# Patient Record
Sex: Male | Born: 1935 | Race: White | Hispanic: No | Smoking: Never smoker
Health system: Southern US, Community
[De-identification: ages and names within clinical notes are randomized; demographics above are authoritative.]

## PROBLEM LIST (undated history)

## (undated) DIAGNOSIS — I429 Cardiomyopathy, unspecified: Secondary | ICD-10-CM

## (undated) DIAGNOSIS — N400 Enlarged prostate without lower urinary tract symptoms: Secondary | ICD-10-CM

## (undated) DIAGNOSIS — I4891 Unspecified atrial fibrillation: Secondary | ICD-10-CM

## (undated) DIAGNOSIS — J309 Allergic rhinitis, unspecified: Secondary | ICD-10-CM

## (undated) DIAGNOSIS — I519 Heart disease, unspecified: Secondary | ICD-10-CM

## (undated) DIAGNOSIS — J449 Chronic obstructive pulmonary disease, unspecified: Secondary | ICD-10-CM

## (undated) DIAGNOSIS — G4700 Insomnia, unspecified: Secondary | ICD-10-CM

## (undated) DIAGNOSIS — I10 Essential (primary) hypertension: Secondary | ICD-10-CM

## (undated) DIAGNOSIS — E785 Hyperlipidemia, unspecified: Secondary | ICD-10-CM

## (undated) DIAGNOSIS — G4733 Obstructive sleep apnea (adult) (pediatric): Secondary | ICD-10-CM

## (undated) DIAGNOSIS — H409 Unspecified glaucoma: Secondary | ICD-10-CM

## (undated) DIAGNOSIS — L729 Follicular cyst of the skin and subcutaneous tissue, unspecified: Secondary | ICD-10-CM

## (undated) DIAGNOSIS — R310 Gross hematuria: Secondary | ICD-10-CM

## (undated) HISTORY — DX: Chronic obstructive pulmonary disease, unspecified: J44.9

## (undated) HISTORY — DX: Benign prostatic hyperplasia without lower urinary tract symptoms: N40.0

## (undated) HISTORY — DX: Hyperlipidemia, unspecified: E78.5

## (undated) HISTORY — DX: Allergic rhinitis, unspecified: J30.9

## (undated) HISTORY — PX: OTHER SURGICAL HISTORY: SHX169

## (undated) HISTORY — PX: ANKLE SURGERY: SHX546

## (undated) HISTORY — DX: Insomnia, unspecified: G47.00

## (undated) HISTORY — DX: Cardiomyopathy, unspecified: I42.9

## (undated) HISTORY — DX: Essential (primary) hypertension: I10

## (undated) HISTORY — DX: Follicular cyst of the skin and subcutaneous tissue, unspecified: L72.9

## (undated) HISTORY — DX: Unspecified atrial fibrillation: I48.91

## (undated) HISTORY — DX: Gross hematuria: R31.0

## (undated) HISTORY — DX: Unspecified glaucoma: H40.9

## (undated) HISTORY — DX: Obstructive sleep apnea (adult) (pediatric): G47.33

## (undated) HISTORY — DX: Heart disease, unspecified: I51.9

---

## 2005-01-17 ENCOUNTER — Ambulatory Visit: Payer: Self-pay

## 2005-07-17 ENCOUNTER — Ambulatory Visit: Payer: Self-pay | Admitting: Urology

## 2008-01-25 ENCOUNTER — Ambulatory Visit: Payer: Self-pay | Admitting: Gastroenterology

## 2011-05-28 ENCOUNTER — Ambulatory Visit: Payer: Self-pay | Admitting: Specialist

## 2014-08-18 DIAGNOSIS — H3320 Serous retinal detachment, unspecified eye: Secondary | ICD-10-CM | POA: Insufficient documentation

## 2014-08-19 DIAGNOSIS — H409 Unspecified glaucoma: Secondary | ICD-10-CM | POA: Insufficient documentation

## 2014-08-19 DIAGNOSIS — G250 Essential tremor: Secondary | ICD-10-CM | POA: Insufficient documentation

## 2014-08-19 DIAGNOSIS — H3530 Unspecified macular degeneration: Secondary | ICD-10-CM | POA: Insufficient documentation

## 2015-02-13 ENCOUNTER — Ambulatory Visit: Payer: Self-pay | Admitting: Urology

## 2015-02-21 DIAGNOSIS — R319 Hematuria, unspecified: Secondary | ICD-10-CM | POA: Insufficient documentation

## 2015-02-28 DIAGNOSIS — N200 Calculus of kidney: Secondary | ICD-10-CM | POA: Insufficient documentation

## 2015-06-19 ENCOUNTER — Other Ambulatory Visit: Payer: Self-pay | Admitting: Family Medicine

## 2015-06-19 DIAGNOSIS — R310 Gross hematuria: Secondary | ICD-10-CM

## 2015-08-21 ENCOUNTER — Other Ambulatory Visit
Admission: RE | Admit: 2015-08-21 | Discharge: 2015-08-21 | Disposition: A | Discharge status: Home / Self Care | Payer: Medicare Other | Source: Ambulatory Visit | Attending: Urology | Admitting: Urology

## 2015-08-21 ENCOUNTER — Ambulatory Visit
Admission: RE | Admit: 2015-08-21 | Discharge: 2015-08-21 | Disposition: A | Discharge status: Home / Self Care | Payer: Medicare Other | Source: Ambulatory Visit | Attending: Urology | Admitting: Urology

## 2015-08-21 DIAGNOSIS — N138 Other obstructive and reflux uropathy: Secondary | ICD-10-CM | POA: Insufficient documentation

## 2015-08-21 DIAGNOSIS — N200 Calculus of kidney: Secondary | ICD-10-CM | POA: Diagnosis not present

## 2015-08-21 DIAGNOSIS — N401 Enlarged prostate with lower urinary tract symptoms: Secondary | ICD-10-CM | POA: Insufficient documentation

## 2015-08-21 DIAGNOSIS — R310 Gross hematuria: Secondary | ICD-10-CM | POA: Diagnosis present

## 2015-08-21 LAB — CREATININE, SERUM
Creatinine, Ser: 0.74 mg/dL (ref 0.61–1.24)
GFR calc Af Amer: >60 mL/min (ref >60)
GFR calc non Af Amer: >60 mL/min (ref >60)

## 2015-08-21 LAB — BUN: BUN: 15 mg/dL (ref 6–20)

## 2015-08-21 MED ORDER — IOHEXOL 300 MG/ML  SOLN
125.0000 mL | Freq: Once | INTRAMUSCULAR | Status: AC | PRN
  Administered 2015-08-21: 125 mL via INTRAVENOUS

## 2015-08-24 ENCOUNTER — Ambulatory Visit: Payer: Medicare Other | Admitting: Urology

## 2015-08-24 ENCOUNTER — Encounter: Payer: Self-pay | Admitting: Urology

## 2015-08-24 VITALS — BP 132/64 | HR 53 | Ht 74.0 in | Wt 262.9 lb

## 2015-08-24 DIAGNOSIS — F329 Major depressive disorder, single episode, unspecified: Secondary | ICD-10-CM | POA: Insufficient documentation

## 2015-08-24 DIAGNOSIS — N400 Enlarged prostate without lower urinary tract symptoms: Secondary | ICD-10-CM | POA: Insufficient documentation

## 2015-08-24 DIAGNOSIS — N442 Benign cyst of testis: Secondary | ICD-10-CM | POA: Insufficient documentation

## 2015-08-24 DIAGNOSIS — G4700 Insomnia, unspecified: Secondary | ICD-10-CM | POA: Insufficient documentation

## 2015-08-24 DIAGNOSIS — G4733 Obstructive sleep apnea (adult) (pediatric): Secondary | ICD-10-CM | POA: Insufficient documentation

## 2015-08-24 DIAGNOSIS — F32A Depression, unspecified: Secondary | ICD-10-CM | POA: Insufficient documentation

## 2015-08-24 DIAGNOSIS — N401 Enlarged prostate with lower urinary tract symptoms: Secondary | ICD-10-CM

## 2015-08-24 DIAGNOSIS — N209 Urinary calculus, unspecified: Secondary | ICD-10-CM

## 2015-08-24 DIAGNOSIS — J449 Chronic obstructive pulmonary disease, unspecified: Secondary | ICD-10-CM | POA: Insufficient documentation

## 2015-08-24 DIAGNOSIS — N138 Other obstructive and reflux uropathy: Secondary | ICD-10-CM

## 2015-08-24 DIAGNOSIS — E7800 Pure hypercholesterolemia, unspecified: Secondary | ICD-10-CM | POA: Insufficient documentation

## 2015-08-24 DIAGNOSIS — IMO0002 Reserved for concepts with insufficient information to code with codable children: Secondary | ICD-10-CM | POA: Insufficient documentation

## 2015-08-24 DIAGNOSIS — J309 Allergic rhinitis, unspecified: Secondary | ICD-10-CM | POA: Insufficient documentation

## 2015-08-24 DIAGNOSIS — I429 Cardiomyopathy, unspecified: Secondary | ICD-10-CM | POA: Insufficient documentation

## 2015-08-24 DIAGNOSIS — R319 Hematuria, unspecified: Secondary | ICD-10-CM

## 2015-08-24 DIAGNOSIS — I4891 Unspecified atrial fibrillation: Secondary | ICD-10-CM | POA: Insufficient documentation

## 2015-08-24 LAB — URINALYSIS, COMPLETE
BILIRUBIN UA: NEGATIVE
Glucose, UA: NEGATIVE
LEUKOCYTES UA: NEGATIVE
NITRITE UA: NEGATIVE
PH UA: 6.5 (ref 5.0–7.5)
Protein, UA: NEGATIVE
RBC UA: NEGATIVE
SPEC GRAV UA: 1.02 (ref 1.005–1.030)

## 2015-08-24 LAB — MICROSCOPIC EXAMINATION
Bacteria, UA: NONE SEEN
Epithelial Cells (non renal): NONE SEEN /hpf (ref 0–10)

## 2015-08-24 NOTE — Progress Notes (Signed)
Urology History and Physical Exam  CC: follow-up of hematuria  HPI: 81 year old male who lives by himself comes in today for follow-up of gross hematuria and BPH. He was evaluated for this earlier this year. Evaluation included CT scan of the abdomen and pelvis as well as cystoscopy. He does have bilateral kidney stones, as well as probable ruptured cyst in the left kidney/retroperitoneum. It was felt that, after this full evaluation, that he had BPH/prostaticissues causing his bleeding. He was appropriately placed on finasteride. He is here today for follow-up.  He denies any significant history of hematuria recently. His stream is a bit better. At times, maybe when he is constipated, it slows down. He has nocturia 2-3. He treats this himself with limiting afternoon and evening fluids.  PMH: Past Medical History  Diagnosis Date  . Cardiomyopathy   . Insomnia   . Obstructive sleep apnea   . Scrotal cyst   . Atrial fibrillation   . BPH (benign prostatic hyperplasia)   . Gross hematuria   . Hypertension   . Hyperlipidemia   . Glaucoma   . COPD (chronic obstructive pulmonary disease)   . Heart disease   . Allergic rhinitis     PSH: Past Surgical History  Procedure Laterality Date  . Testicular cyst    . Detached retina repair    . Ankle surgery      Allergies: No Known Allergies  Medications:  (Not in a hospital admission)   Social History: Social History   Social History  . Marital Status: Widowed    Spouse Name: N/A  . Number of Children: N/A  . Years of Education: N/A   Occupational History  . Not on file.   Social History Main Topics  . Smoking status: Never Smoker   . Smokeless tobacco: Not on file  . Alcohol Use: No  . Drug Use: No  . Sexual Activity: Not on file   Other Topics Concern  . Not on file   Social History Narrative    Family History: No family history on file.  Review of Systems: Positive: nocturia, occasional slow  stream Negative: .  A further 10 point review of systems was negative except what is listed in the HPI.                  Physical Exam: Filed Vitals:   08/24/15 1148  Height:  (1.88 m)  Weight: 262 lb 14.4 oz (119.251 kg)   General: No acute distress.  Awake. Head:  Normocephalic.  Atraumatic. ENT:  EOMI.  Mucous membranes moist Pulmonary: Equal effort bilaterally.   No visible rash. Extremity: No gross deformity of bilateral upper extremities.  No gross deformity of     lower extremities. Neurologic: Alert. Appropriate mood.    Studies:  No results for input(s): HGB, WBC, PLT in the last 72 hours.  Recent Labs     08/21/15  1243  BUN  15  CREATININE  0.74  GFRNONAA  >60  GFRAA  >60     No results for input(s): INR, APTT in the last 72 hours.  Invalid input(s): PT   Invalid input(s): ABG  Urinalysis is clear. I reviewed the patient's prior CT records. Evaluation was also reviewed Assessment:   1. BPH with probable hematuria, improved with finasteride  2. Possible ruptured left renal cyst, asymptomatic   3. Asymptomatic renal calculi  Plan: 1. I recommended that he continue his finasteride long-term  2. I recommended that he  increase fiber intake to decrease constipation  3. Office visit in 1 year for recheck

## 2015-12-18 ENCOUNTER — Inpatient Hospital Stay
Admission: EM | Admit: 2015-12-18 | Discharge: 2015-12-19 | DRG: 310 | Disposition: A | Discharge status: Home / Self Care | Payer: Medicare Other | Attending: Internal Medicine | Admitting: Internal Medicine

## 2015-12-18 ENCOUNTER — Encounter: Payer: Self-pay | Admitting: Emergency Medicine

## 2015-12-18 DIAGNOSIS — I482 Chronic atrial fibrillation: Secondary | ICD-10-CM | POA: Diagnosis present

## 2015-12-18 DIAGNOSIS — G4733 Obstructive sleep apnea (adult) (pediatric): Secondary | ICD-10-CM | POA: Diagnosis present

## 2015-12-18 DIAGNOSIS — E785 Hyperlipidemia, unspecified: Secondary | ICD-10-CM | POA: Diagnosis present

## 2015-12-18 DIAGNOSIS — I495 Sick sinus syndrome: Principal | ICD-10-CM | POA: Diagnosis present

## 2015-12-18 DIAGNOSIS — R001 Bradycardia, unspecified: Secondary | ICD-10-CM | POA: Diagnosis not present

## 2015-12-18 DIAGNOSIS — N400 Enlarged prostate without lower urinary tract symptoms: Secondary | ICD-10-CM | POA: Diagnosis present

## 2015-12-18 DIAGNOSIS — I10 Essential (primary) hypertension: Secondary | ICD-10-CM | POA: Diagnosis present

## 2015-12-18 DIAGNOSIS — J449 Chronic obstructive pulmonary disease, unspecified: Secondary | ICD-10-CM | POA: Diagnosis present

## 2015-12-18 DIAGNOSIS — F419 Anxiety disorder, unspecified: Secondary | ICD-10-CM | POA: Diagnosis present

## 2015-12-18 DIAGNOSIS — I429 Cardiomyopathy, unspecified: Secondary | ICD-10-CM | POA: Diagnosis present

## 2015-12-18 DIAGNOSIS — F329 Major depressive disorder, single episode, unspecified: Secondary | ICD-10-CM | POA: Diagnosis present

## 2015-12-18 DIAGNOSIS — Z7982 Long term (current) use of aspirin: Secondary | ICD-10-CM | POA: Diagnosis not present

## 2015-12-18 DIAGNOSIS — Z79899 Other long term (current) drug therapy: Secondary | ICD-10-CM | POA: Diagnosis not present

## 2015-12-18 LAB — URINALYSIS COMPLETE WITH MICROSCOPIC (ARMC ONLY)
Bacteria, UA: NONE SEEN
Bilirubin Urine: NEGATIVE
GLUCOSE, UA: NEGATIVE mg/dL
LEUKOCYTES UA: NEGATIVE
NITRITE: NEGATIVE
Protein, ur: 30 mg/dL — AB
SPECIFIC GRAVITY, URINE: 1.017 (ref 1.005–1.030)
Squamous Epithelial / LPF: NONE SEEN
pH: 6 (ref 5.0–8.0)

## 2015-12-18 LAB — BASIC METABOLIC PANEL
Anion gap: 11 (ref 5–15)
BUN: 17 mg/dL (ref 6–20)
CALCIUM: 9.5 mg/dL (ref 8.9–10.3)
CO2: 26 mmol/L (ref 22–32)
CREATININE: 0.62 mg/dL (ref 0.61–1.24)
Chloride: 105 mmol/L (ref 101–111)
GFR calc Af Amer: >60 mL/min (ref >60)
GLUCOSE: 109 mg/dL — AB (ref 65–99)
POTASSIUM: 4.2 mmol/L (ref 3.5–5.1)
Sodium: 142 mmol/L (ref 135–145)

## 2015-12-18 LAB — CBC
HCT: 42.8 % (ref 40.0–52.0)
Hemoglobin: 13.8 g/dL (ref 13.0–18.0)
MCH: 31.6 pg (ref 26.0–34.0)
MCHC: 32.1 g/dL (ref 32.0–36.0)
MCV: 98.4 fL (ref 80.0–100.0)
PLATELETS: 83 10*3/uL — AB (ref 150–440)
RBC: 4.35 MIL/uL — ABNORMAL LOW (ref 4.40–5.90)
RDW: 14.3 % (ref 11.5–14.5)
WBC: 6.1 10*3/uL (ref 3.8–10.6)

## 2015-12-18 LAB — HEPATIC FUNCTION PANEL
ALK PHOS: 66 U/L (ref 38–126)
ALT: 24 U/L (ref 17–63)
AST: 27 U/L (ref 15–41)
Albumin: 4.3 g/dL (ref 3.5–5.0)
BILIRUBIN DIRECT: 0.4 mg/dL (ref 0.1–0.5)
BILIRUBIN INDIRECT: 1.3 mg/dL — AB (ref 0.3–0.9)
BILIRUBIN TOTAL: 1.7 mg/dL — AB (ref 0.3–1.2)
Total Protein: 6.8 g/dL (ref 6.5–8.1)

## 2015-12-18 LAB — TSH
TSH: 1.64 u[IU]/mL (ref 0.350–4.500)
TSH: 1.478 u[IU]/mL (ref 0.350–4.500)

## 2015-12-18 LAB — BRAIN NATRIURETIC PEPTIDE: B Natriuretic Peptide: 404 pg/mL — ABNORMAL HIGH (ref 0.0–100.0)

## 2015-12-18 LAB — TROPONIN I
TROPONIN I: 0.04 ng/mL — AB (ref <0.031)
TROPONIN I: 0.04 ng/mL — AB (ref <0.031)
TROPONIN I: 0.05 ng/mL — AB (ref <0.031)

## 2015-12-18 LAB — LIPASE, BLOOD: Lipase: 19 U/L (ref 11–51)

## 2015-12-18 MED ORDER — NAPROXEN 500 MG PO TABS: 250.0000 mg | ORAL_TABLET | Freq: Every day | ORAL | Status: DC | PRN

## 2015-12-18 MED ORDER — NAPROXEN SODIUM 220 MG PO TABS: 220.0000 mg | ORAL_TABLET | Freq: Every day | ORAL | Status: DC | PRN

## 2015-12-18 MED ORDER — PAROXETINE HCL 20 MG PO TABS
20.0000 mg | ORAL_TABLET | Freq: Every evening | ORAL | Status: DC
Start: 2015-12-18 — End: 2015-12-19
  Administered 2015-12-18: 20 mg via ORAL
  Filled 2015-12-18: qty 1

## 2015-12-18 MED ORDER — DOXAZOSIN MESYLATE 4 MG PO TABS
4.0000 mg | ORAL_TABLET | Freq: Every day | ORAL | Status: DC
  Administered 2015-12-18 – 2015-12-19 (×2): 4 mg via ORAL
  Filled 2015-12-18 (×2): qty 1

## 2015-12-18 MED ORDER — SODIUM CHLORIDE 0.9 % IV SOLN: 250.0000 mL | INTRAVENOUS | Status: DC | PRN

## 2015-12-18 MED ORDER — ASPIRIN 81 MG PO CHEW
324.0000 mg | CHEWABLE_TABLET | Freq: Once | ORAL | Status: AC
  Administered 2015-12-18: 324 mg via ORAL
  Filled 2015-12-18: qty 4

## 2015-12-18 MED ORDER — IPRATROPIUM-ALBUTEROL 0.5-2.5 (3) MG/3ML IN SOLN: 3.0000 mL | RESPIRATORY_TRACT | Status: DC | PRN

## 2015-12-18 MED ORDER — SODIUM CHLORIDE 0.9 % IJ SOLN
3.0000 mL | Freq: Two times a day (BID) | INTRAMUSCULAR | Status: DC
Start: 2015-12-18 — End: 2015-12-19
  Administered 2015-12-18 – 2015-12-19 (×2): 3 mL via INTRAVENOUS

## 2015-12-18 MED ORDER — SODIUM CHLORIDE 0.9 % IJ SOLN
3.0000 mL | INTRAMUSCULAR | Status: DC | PRN
Start: 2015-12-18 — End: 2015-12-19

## 2015-12-18 MED ORDER — ACETAMINOPHEN 650 MG RE SUPP: 650.0000 mg | Freq: Four times a day (QID) | RECTAL | Status: DC | PRN

## 2015-12-18 MED ORDER — ASPIRIN EC 81 MG PO TBEC
81.0000 mg | DELAYED_RELEASE_TABLET | Freq: Once | ORAL | Status: AC
  Administered 2015-12-18: 81 mg via ORAL
  Filled 2015-12-18: qty 1

## 2015-12-18 MED ORDER — ACETAMINOPHEN 325 MG PO TABS: 650.0000 mg | ORAL_TABLET | Freq: Four times a day (QID) | ORAL | Status: DC | PRN

## 2015-12-18 MED ORDER — AMLODIPINE BESYLATE 5 MG PO TABS
2.5000 mg | ORAL_TABLET | Freq: Every day | ORAL | Status: DC
  Administered 2015-12-19: 2.5 mg via ORAL
  Filled 2015-12-18: qty 1

## 2015-12-18 MED ORDER — ONDANSETRON HCL 4 MG/2ML IJ SOLN: 4.0000 mg | Freq: Four times a day (QID) | INTRAMUSCULAR | Status: DC | PRN

## 2015-12-18 MED ORDER — SIMVASTATIN 20 MG PO TABS
20.0000 mg | ORAL_TABLET | Freq: Every day | ORAL | Status: DC
  Administered 2015-12-18: 20 mg via ORAL
  Filled 2015-12-18: qty 1

## 2015-12-18 MED ORDER — HEPARIN SODIUM (PORCINE) 5000 UNIT/ML IJ SOLN
5000.0000 [IU] | Freq: Three times a day (TID) | INTRAMUSCULAR | Status: DC
  Administered 2015-12-18 – 2015-12-19 (×3): 5000 [IU] via SUBCUTANEOUS
  Filled 2015-12-18 (×2): qty 1

## 2015-12-18 MED ORDER — ONDANSETRON HCL 4 MG PO TABS: 4.0000 mg | ORAL_TABLET | Freq: Four times a day (QID) | ORAL | Status: DC | PRN

## 2015-12-18 MED ORDER — ASPIRIN EC 81 MG PO TBEC
81.0000 mg | DELAYED_RELEASE_TABLET | Freq: Every day | ORAL | Status: DC
  Administered 2015-12-19: 81 mg via ORAL
  Filled 2015-12-18: qty 1

## 2015-12-18 MED ORDER — FINASTERIDE 5 MG PO TABS
5.0000 mg | ORAL_TABLET | Freq: Every day | ORAL | Status: DC
  Administered 2015-12-19: 5 mg via ORAL
  Filled 2015-12-18: qty 1

## 2015-12-18 MED ORDER — ADULT MULTIVITAMIN W/MINERALS CH
1.0000 | ORAL_TABLET | Freq: Every day | ORAL | Status: DC
  Administered 2015-12-18 – 2015-12-19 (×2): 1 via ORAL
  Filled 2015-12-18 (×2): qty 1

## 2015-12-18 NOTE — ED Notes (Signed)
Pt presents with nausea and weakness for three weeks. Was told by pcp to come to ED for further eval.

## 2015-12-18 NOTE — ED Notes (Signed)
Patient was sent by PCP. Patient states "I had an EKG done last week and I called my doctors office today for results and was told to come to ER" Patient is bradycardia with HR fluctuating from 33-65. Patient is A&O X4

## 2015-12-18 NOTE — Consult Note (Signed)
[facility] Cardiology  CARDIOLOGY CONSULT NOTE  Patient ID: Shaun Curtis MRN: [id number] DOB/AGE: 04-19-1929 81 y.o.  Admit date: 12/18/2015 Referring Physician Allena Katz Primary Physician [facility] Primary Cardiologist Gwen Pounds Reason for Consultation bradycardia  HPI: The patient is an 81 year old gentleman with history of chronic atrial fibrillation, essential hypertension, sleep apnea, referred for evaluation of bradycardia. The patient reports approximately 2 months ago he was started on propanolol or tremors. He presents with 3-4 week history of fatigue, weakness and intermittent lightheadedness. The patient presented to [facility] emergency room where his noted be in junctional bradycardia with heart rates in the 40s. The patient is hemodynamically stable. He denies chest pain. He does have exertional dyspnea and postural dizziness.  Review of systems complete and found to be negative unless listed above     Past Medical History  Diagnosis Date  . Cardiomyopathy (HCC)   . Insomnia   . Obstructive sleep apnea   . Scrotal cyst   . Atrial fibrillation (HCC)   . BPH (benign prostatic hyperplasia)   . Gross hematuria   . Hypertension   . Hyperlipidemia   . Glaucoma   . COPD (chronic obstructive pulmonary disease) (HCC)   . Heart disease   . Allergic rhinitis     Past Surgical History  Procedure Laterality Date  . Testicular cyst    . Detached retina repair    . Ankle surgery      Prescriptions prior to admission  Medication Sig Dispense Refill Last Dose  . amLODipine (NORVASC) 2.5 MG tablet TAKE 1 TABLET (2.5 MG TOTAL) BY MOUTH ONCE DAILY. FOR BLOOD PRESSURE  99 12/18/2015 at 0830  . aspirin EC 81 MG tablet Take 81 mg by mouth daily.    12/18/2015 at 0830  . Brinzolamide-Brimonidine (SIMBRINZA) 1-0.2 % SUSP Place 1 drop into both eyes 2 (two) times daily.    12/18/2015 at 0830  . doxazosin (CARDURA) 4 MG tablet TAKE 1 TABLET (4 MG TOTAL) BY MOUTH NIGHTLY. FOR BLOOD PRESSURE AND  PROSTATE  99 12/17/2015 at Unknown time  . finasteride (PROSCAR) 5 MG tablet Take 5 mg by mouth daily.    12/18/2015 at 0830  . Multiple Vitamins-Minerals (ICAPS) CAPS Take 1 capsule by mouth daily.    12/17/2015 at Unknown time  . naproxen sodium (RA NAPROXEN SODIUM) 220 MG tablet Take 220 mg by mouth daily as needed.    prn at prn  . PARoxetine (PAXIL) 20 MG tablet Take 20 mg by mouth every evening.    12/17/2015 at Unknown time  . propranolol (INDERAL) 20 MG tablet Take 20 mg by mouth 2 (two) times daily.   12/18/2015 at 0830  . simvastatin (ZOCOR) 20 MG tablet Take 20 mg by mouth daily at 6 PM.    12/17/2015 at Unknown time   Social History   Social History  . Marital Status: Widowed    Spouse Name: N/A  . Number of Children: N/A  . Years of Education: N/A   Occupational History  . Not on file.   Social History Main Topics  . Smoking status: Never Smoker   . Smokeless tobacco: Not on file  . Alcohol Use: No  . Drug Use: No  . Sexual Activity: Not on file   Other Topics Concern  . Not on file   Social History Narrative    Family History  Problem Relation Age of Onset  . Hypertension        Review of systems complete and found to be negative  unless listed above      PHYSICAL EXAM  General: Well developed, well nourished, in no acute distress HEENT:  Normocephalic and atramatic Neck:  No JVD.  Lungs: Clear bilaterally to auscultation and percussion. Heart: HRRR . Normal S1 and S2 without gallops or murmurs.  Abdomen: Bowel sounds are positive, abdomen soft and non-tender  Msk:  Back normal, normal gait. Normal strength and tone for age. Extremities: No clubbing, cyanosis or edema.   Neuro: Alert and oriented X 3. Psych:  Good affect, responds appropriately  Labs:   Lab Results  Component Value Date   WBC 6.1 12/18/2015   HGB 13.8 12/18/2015   HCT 42.8 12/18/2015   MCV 98.4 12/18/2015   PLT 83* 12/18/2015    Recent Labs Lab 12/18/15 1155  NA 142  K  4.2  CL 105  CO2 26  BUN 17  CREATININE 0.62  CALCIUM 9.5  PROT 6.8  BILITOT 1.7*  ALKPHOS 66  ALT 24  AST 27  GLUCOSE 109*   Lab Results  Component Value Date   TROPONINI 0.04* 12/18/2015   No results found for: CHOL No results found for: HDL No results found for: LDLCALC No results found for: TRIG No results found for: CHOLHDL No results found for: LDLDIRECT    Radiology: No results found.  EKG: Junctional bradycardia in the 40s  ASSESSMENT AND PLAN:   1. Junctional bradycardia, likely exacerbated by recent addition of propranolol for treatment of tremors 2. Chronic atrial fibrillation, chads Vasc of 3, currently on aspirin 3. Essential hypertension  Recommendations  1. Hold propanolol 2. Review 2-D echocardiogram 3. No indication at this time for external or transvenous pacemaker, patient hemodynamically stable 4. Further recommendations, including permanent pacemaker, pending patient's initial clinical course off of propranolol    Signed: Kaisyn Reinhold MD,PhD, [facility] 12/18/2015, 5:08 PM

## 2015-12-18 NOTE — Progress Notes (Signed)
Patient admitted to floor. A@O . No c/o pain. Sob with exertion, tolerating RA. Telemetry verified by nurse and tech current hr 60's. Skin verified by second nurse doll ferguson rn

## 2015-12-18 NOTE — ED Provider Notes (Signed)
[facility] Department Provider Note  ____________________________________________   I have reviewed the triage vital signs and the nursing notes.   HISTORY  Chief Complaint Nausea and Fatigue    HPI Shaun Curtis is a 81 y.o. male with a history of not otherwise specified but not apparently ischemic cardiomyopathy with EF of 45% during his last echo presents to 3 weeks of nausea especially when he exerts himself as well as shortness of breath on exertion. Denies fever chills or vomiting. No chest pain. Denies significant leg swelling. Is compliant with his medication. Had an EKG done today by primary care and was sent in for further evaluation. Patient at this time is feeling "okay".  Past Medical History  Diagnosis Date  . Cardiomyopathy (HCC)   . Insomnia   . Obstructive sleep apnea   . Scrotal cyst   . Atrial fibrillation (HCC)   . BPH (benign prostatic hyperplasia)   . Gross hematuria   . Hypertension   . Hyperlipidemia   . Glaucoma   . COPD (chronic obstructive pulmonary disease) (HCC)   . Heart disease   . Allergic rhinitis     Patient Active Problem List   Diagnosis Date Noted  . Allergic rhinitis 08/24/2015  . A-fib (HCC) 08/24/2015  . Benign fibroma of prostate 08/24/2015  . Cardiomyopathy (HCC) 08/24/2015  . Insomnia, persistent 08/24/2015  . CAFL (chronic airflow limitation) (HCC) 08/24/2015  . Clinical depression 08/24/2015  . Hypercholesteremia 08/24/2015  . Obstructive apnea 08/24/2015  . Cyst of testis 08/24/2015  . Change in blood platelet count 08/24/2015  . Calculus of kidney 02/28/2015  . Blood in the urine 02/21/2015  . Age-related macular degeneration 08/19/2014  . Benign essential tremor 08/19/2014  . Glaucoma 08/19/2014  . Detached retina 08/18/2014    Past Surgical History  Procedure Laterality Date  . Testicular cyst    . Detached retina repair    . Ankle surgery      Current Outpatient Rx   Name  Route  Sig  Dispense  Refill  . amLODipine (NORVASC) 2.5 MG tablet      TAKE 1 TABLET (2.5 MG TOTAL) BY MOUTH ONCE DAILY. FOR BLOOD PRESSURE      99   . aspirin EC 81 MG tablet   Oral   Take by mouth.         . Brinzolamide-Brimonidine (SIMBRINZA) 1-0.2 % SUSP   Ophthalmic   Apply to eye.         Marland Kitchen. doxazosin (CARDURA) 4 MG tablet      TAKE 1 TABLET (4 MG TOTAL) BY MOUTH NIGHTLY. FOR BLOOD PRESSURE AND PROSTATE      99   . finasteride (PROSCAR) 5 MG tablet               . Multiple Vitamins-Minerals (ICAPS) CAPS   Oral   Take by mouth.         . naproxen sodium (RA NAPROXEN SODIUM) 220 MG tablet   Oral   Take by mouth.         Marland Kitchen. PARoxetine (PAXIL) 20 MG tablet   Oral   Take by mouth.         . simvastatin (ZOCOR) 20 MG tablet                 Allergies Review of patient's allergies indicates no known allergies.  No family history on file.  Social History Social History  Substance Use Topics  . Smoking  status: Never Smoker   . Smokeless tobacco: None  . Alcohol Use: No    Review of Systems Constitutional: No fever/chills Eyes: No visual changes. ENT: No sore throat. No stiff neck no neck pain Cardiovascular: Denies chest pain. Respiratory: Positive for shortness of breath. Gastrointestinal:   no vomiting.  No diarrhea.  No constipation.no  Melena no bright red blood per rectum Genitourinary: Negative for dysuria. Musculoskeletal: Negative lower extremity swelling Skin: Negative for rash. Neurological: Negative for headaches, focal weakness or numbness. 10-point ROS otherwise negative.  ____________________________________________   PHYSICAL EXAM:  VITAL SIGNS: ED Triage Vitals  Enc Vitals Group     BP 12/18/15 1149 163/57 mmHg     Pulse Rate 12/18/15 1149 78     Resp 12/18/15 1149 18     Temp 12/18/15 1149 98.4 F (36.9 C)     Temp Source 12/18/15 1149 Oral     SpO2 12/18/15 1230 97 %     Weight 12/18/15 1149 260 lb  (117.935 kg)     Height 12/18/15 1149  (1.88 m)     Head Cir --      Peak Flow --      Pain Score --      Pain Loc --      Pain Edu? --      Excl. in GC? --     Constitutional: Alert and oriented. Well appearing and in no acute distress. Eyes: Conjunctivae are normal. PERRL. EOMI. Head: Atraumatic. Nose: No congestion/rhinnorhea. Mouth/Throat: Mucous membranes are moist.  Oropharynx non-erythematous. Neck: No stridor.   Nontender with no meningismus Cardiovascular: Bradycardia noted but no obvious murmur, good peripheral circulation Respiratory: Normal respiratory effort.  No retractions. Lungs CTAB. Abdominal: Soft and nontender. No distention. No guarding no rebound Back:  There is no focal tenderness or step off there is no midline tenderness there are no lesions noted. there is no CVA tenderness Musculoskeletal: No lower extremity tenderness. No joint effusions, no DVT signs strong distal pulses no edema Neurologic:  Normal speech and language. No gross focal neurologic deficits are appreciated.  Skin:  Skin is warm, dry and intact. No rash noted. Psychiatric: Mood and affect are normal. Speech and behavior are normal.  ____________________________________________   LABS (all labs ordered are listed, but only abnormal results are displayed)  Labs Reviewed  BASIC METABOLIC PANEL - Abnormal; Notable for the following:    Glucose, Bld 109 (*)    All other components within normal limits  CBC - Abnormal; Notable for the following:    RBC 4.35 (*)    Platelets 83 (*)    All other components within normal limits  URINALYSIS COMPLETEWITH MICROSCOPIC ([facility] ONLY) - Abnormal; Notable for the following:    Color, Urine YELLOW (*)    APPearance HAZY (*)    Ketones, ur TRACE (*)    Hgb urine dipstick 3+ (*)    Protein, ur 30 (*)    All other components within normal limits  TROPONIN I  TSH  BRAIN NATRIURETIC PEPTIDE  HEPATIC FUNCTION PANEL  LIPASE, BLOOD    ____________________________________________  EKG  I personally interpreted any EKGs ordered by me or triage Bradycardic rhythm, likely junctional, diffuse ST changes noted no acute ST elevation. ____________________________________________  RADIOLOGY  I reviewed any imaging ordered by me or triage that were performed during my shift ____________________________________________   PROCEDURES  Procedure(s) performed: None  Critical Care performed: None  ____________________________________________   INITIAL IMPRESSION / ASSESSMENT AND PLAN /  ED COURSE  Pertinent labs & imaging results that were available during my care of the patient were reviewed by me and considered in my medical decision making (see chart for details).  Patient with history of cardiomyopathy presents without chest pain but with nausea and exertional dyspnea and a bradycardia but preserved blood pressure. I do suspect his bradycardia is secondary to an underlying cardiac pathology. Patient has a borderline troponin. We will give him aspirin. I did discuss with his cardiologist Dr. Gwen Pounds, who agrees with plan and management and the hospitalist will admit the patient. ____________________________________________   FINAL CLINICAL IMPRESSION(S) / ED DIAGNOSES  Final diagnoses:  None     Jeanmarie Plant, MD 12/18/15 1404

## 2015-12-18 NOTE — H&P (Signed)
[facility] - [place] at [facility]   PATIENT NAME: Shaun Curtis    MR#:  [id number]  DATE OF BIRTH:  05/27/1929  DATE OF ADMISSION:  12/18/2015  PRIMARY CARE PHYSICIAN: Mickey Farber, MD   REQUESTING/REFERRING PHYSICIAN: Ileana Roup M.D.  CHIEF COMPLAINT:   Chief Complaint  Patient presents with  . Nausea  . Fatigue    HISTORY OF PRESENT ILLNESS: Shaun Curtis  is a 81 y.o. male with a known history of  Cardiomyopathy, obstructive sleep apnea not using CPAP on regular basis, atrial fibrillation, BPH, hypertension, hyperlipidemia, and COPD who presents to the emergency room with complaint of having nausea for the past 4 weeks. He has also had progressive fatigue and this and weakness. He has had episodes of near syncope. He also complains of dyspnea on exertion. Patient presented to the emergency room with these complaints and was noted to have bradycardia with junctional rhythm concerning for sick sinus syndrome. The emergency room physician has spoken to his cardiologist Dr. Gwen Pounds who will evaluate the patient.  PAST MEDICAL HISTORY:   Past Medical History  Diagnosis Date  . Cardiomyopathy (HCC)   . Insomnia   . Obstructive sleep apnea   . Scrotal cyst   . Atrial fibrillation (HCC)   . BPH (benign prostatic hyperplasia)   . Gross hematuria   . Hypertension   . Hyperlipidemia   . Glaucoma   . COPD (chronic obstructive pulmonary disease) (HCC)   . Heart disease   . Allergic rhinitis     PAST SURGICAL HISTORY:  Past Surgical History  Procedure Laterality Date  . Testicular cyst    . Detached retina repair    . Ankle surgery      SOCIAL HISTORY:  Social History  Substance Use Topics  . Smoking status: Never Smoker   . Smokeless tobacco: Not on file  . Alcohol Use: No    FAMILY HISTORY:  Family History  Problem Relation Age of Onset  . Hypertension      DRUG ALLERGIES: No Known Allergies  REVIEW OF SYSTEMS:    CONSTITUTIONAL: No fever,  postive fatigue or weakness.  EYES: No blurred or double vision.  EARS, NOSE, AND THROAT: No tinnitus or ear pain.  RESPIRATORY: No cough, shortness of breath, wheezing or hemoptysis.  CARDIOVASCULAR: No chest pain, orthopnea, edema.  GASTROINTESTINAL: + nausea, vomiting, diarrhea or abdominal pain.  GENITOURINARY: No dysuria, hematuria.  ENDOCRINE: No polyuria, nocturia,  HEMATOLOGY: No anemia, easy bruising or bleeding SKIN: No rash or lesion. MUSCULOSKELETAL: No joint pain or arthritis.   NEUROLOGIC: No tingling, numbness, weakness.  PSYCHIATRY: No anxiety or depression.   MEDICATIONS AT HOME:  Prior to Admission medications   Medication Sig Start Date End Date Taking? Authorizing Provider  amLODipine (NORVASC) 2.5 MG tablet TAKE 1 TABLET (2.5 MG TOTAL) BY MOUTH ONCE DAILY. FOR BLOOD PRESSURE 08/03/15  Yes Historical Provider, MD  aspirin EC 81 MG tablet Take 81 mg by mouth daily.    Yes Historical Provider, MD  Brinzolamide-Brimonidine [facility]) 1-0.2 % SUSP Place 1 drop into both eyes 2 (two) times daily.  08/18/14  Yes Historical Provider, MD  doxazosin (CARDURA) 4 MG tablet TAKE 1 TABLET (4 MG TOTAL) BY MOUTH NIGHTLY. FOR BLOOD PRESSURE AND PROSTATE 08/03/15  Yes Historical Provider, MD  finasteride (PROSCAR) 5 MG tablet Take 5 mg by mouth daily.  08/22/15  Yes Historical Provider, MD  Multiple Vitamins-Minerals (ICAPS) CAPS Take 1 capsule by mouth daily.  Yes Historical Provider, MD  naproxen sodium (RA NAPROXEN SODIUM) 220 MG tablet Take 220 mg by mouth daily as needed.    Yes Historical Provider, MD  PARoxetine (PAXIL) 20 MG tablet Take 20 mg by mouth every evening.  02/21/15  Yes Historical Provider, MD  propranolol (INDERAL) 20 MG tablet Take 20 mg by mouth 2 (two) times daily.   Yes Historical Provider, MD  simvastatin (ZOCOR) 20 MG tablet Take 20 mg by mouth daily at 6 PM.  08/23/15  Yes Historical Provider, MD      PHYSICAL EXAMINATION:    VITAL SIGNS: Blood pressure 149/64, pulse 47, temperature 97.8 F (36.6 C), temperature source Oral, resp. rate 24, height 6\' 2"  (1.88 m), weight 117.935 kg (260 lb), SpO2 99 %.  GENERAL:  81 y.o.-year-old patient lying in the bed with no acute distress.  EYES: Pupils equal, round, reactive to light and accommodation. No scleral icterus. Extraocular muscles intact.  HEENT: Head atraumatic, normocephalic. Oropharynx and nasopharynx clear.  NECK:  Supple, no jugular venous distention. No thyroid enlargement, no tenderness.  LUNGS: Normal breath sounds bilaterally, no wheezing, rales,rhonchi or crepitation. No use of accessory muscles of respiration.  CARDIOVASCULAR: S1, S2 normal. No murmurs, rubs, or gallops.  ABDOMEN: Soft, nontender, nondistended. Bowel sounds present. No organomegaly or mass.  EXTREMITIES: No pedal edema, cyanosis, or clubbing.  NEUROLOGIC: Cranial nerves II through XII are intact. Muscle strength 5/5 in all extremities. Sensation intact. Gait not checked.  PSYCHIATRIC: The patient is alert and oriented x 3.  SKIN: No obvious rash, lesion, or ulcer.   LABORATORY PANEL:   CBC  Recent Labs Lab 12/18/15 1155  WBC 6.1  HGB 13.8  HCT 42.8  PLT 83*  MCV 98.4  MCH 31.6  MCHC 32.1  RDW 14.3   ------------------------------------------------------------------------------------------------------------------  Chemistries   Recent Labs Lab 12/18/15 1155  NA 142  K 4.2  CL 105  CO2 26  GLUCOSE 109*  BUN 17  CREATININE 0.62  CALCIUM 9.5  AST 27  ALT 24  ALKPHOS 66  BILITOT 1.7*   ------------------------------------------------------------------------------------------------------------------ estimated creatinine clearance is 93.8 mL/min (by C-G formula based on Cr of 0.62). ------------------------------------------------------------------------------------------------------------------  Recent Labs  12/18/15 1155  TSH 1.640     Coagulation  profile No results for input(s): INR, PROTIME in the last 168 hours. ------------------------------------------------------------------------------------------------------------------- No results for input(s): DDIMER in the last 72 hours. -------------------------------------------------------------------------------------------------------------------  Cardiac Enzymes  Recent Labs Lab 12/18/15 1155  TROPONINI 0.04*   ------------------------------------------------------------------------------------------------------------------ Invalid input(s): POCBNP  ---------------------------------------------------------------------------------------------------------------  Urinalysis    Component Value Date/Time   COLORURINE YELLOW* 12/18/2015 1225   APPEARANCEUR HAZY* 12/18/2015 1225   LABSPEC 1.017 12/18/2015 1225   PHURINE 6.0 12/18/2015 1225   GLUCOSEU NEGATIVE 12/18/2015 1225   HGBUR 3+* 12/18/2015 1225   BILIRUBINUR NEGATIVE 12/18/2015 1225   BILIRUBINUR Negative 08/24/2015 1151   KETONESUR TRACE* 12/18/2015 1225   PROTEINUR 30* 12/18/2015 1225   NITRITE NEGATIVE 12/18/2015 1225   NITRITE Negative 08/24/2015 1151   LEUKOCYTESUR NEGATIVE 12/18/2015 1225   LEUKOCYTESUR Negative 08/24/2015 1151     RADIOLOGY: No results found.  EKG: Orders placed or performed during the hospital encounter of 12/18/15  . ED EKG  . ED EKG  . EKG 12-Lead  . EKG 12-Lead    IMPRESSION AND PLAN: Patient is a 81 year old white male presents with weakness shortness of breath nausea  1. Symptomatic bradycardia: Could be related to sick sinus syndrome he is on a per parent all which lowers the  heart rate, I will discontinue that, patient will be on telemetry. His cardiologist has been notified. We'll obtain echocardiogram of the heart.  If heart rate drops below 30 then he'll need external pacemaker and or IV inotropic agents.  2. Hypertension continue low-dose amlodipine  3. BPH continue  Cardura and Proscar  4. Anxiety depression continue Paxil  5. History of COPD use when necessary nebulizers as needed  6. Hyperlipidemia continue Zocor  7. Miscellaneous DVT prophylaxis with heparin   All the records are reviewed and case discussed with ED provider. Management plans discussed with the patient, family and they are in agreement.  CODE STATUS: Full    TOTAL TIME TAKING CARE OF THIS PATIENT:55 minutes.    Auburn Bilberry M.D on 12/18/2015 at 3:54 PM  Between 7am to 6pm - Pager - [phone]  After 6pm go to [email or web address] - password [email or web address] [facility]  [place] [facility]  Office  [phone]  CC: Primary care physician; Mickey Farber, MD

## 2015-12-19 ENCOUNTER — Inpatient Hospital Stay
Admit: 2015-12-19 | Discharge: 2015-12-19 | Disposition: A | Discharge status: Home / Self Care | Payer: Medicare Other | Attending: Internal Medicine | Admitting: Internal Medicine

## 2015-12-19 LAB — CBC
HEMATOCRIT: 42 % (ref 40.0–52.0)
Hemoglobin: 13.8 g/dL (ref 13.0–18.0)
MCH: 32.3 pg (ref 26.0–34.0)
MCHC: 32.9 g/dL (ref 32.0–36.0)
MCV: 98.2 fL (ref 80.0–100.0)
PLATELETS: 88 10*3/uL — AB (ref 150–440)
RBC: 4.28 MIL/uL — ABNORMAL LOW (ref 4.40–5.90)
RDW: 14.4 % (ref 11.5–14.5)
WBC: 6.4 10*3/uL (ref 3.8–10.6)

## 2015-12-19 LAB — BASIC METABOLIC PANEL
Anion gap: 8 (ref 5–15)
BUN: 15 mg/dL (ref 6–20)
CALCIUM: 9.5 mg/dL (ref 8.9–10.3)
CO2: 30 mmol/L (ref 22–32)
Chloride: 106 mmol/L (ref 101–111)
Creatinine, Ser: 0.78 mg/dL (ref 0.61–1.24)
GFR calc Af Amer: >60 mL/min (ref >60)
GLUCOSE: 104 mg/dL — AB (ref 65–99)
POTASSIUM: 4.4 mmol/L (ref 3.5–5.1)
Sodium: 144 mmol/L (ref 135–145)

## 2015-12-19 LAB — TROPONIN I: Troponin I: 0.05 ng/mL — ABNORMAL HIGH (ref <0.031)

## 2015-12-19 NOTE — Progress Notes (Signed)
Per Dr. Darrold JunkerParaschos okay to discharge patient.

## 2015-12-19 NOTE — Progress Notes (Signed)
Patient received discharge instructions, pt verbalized understanding. IV was removed with no signs of infection. Dressing clean, dry intact. No skin tears or wounds present. Patient was escorted out with staff member via wheelchair via private auto. No further needs from care management team.  

## 2015-12-19 NOTE — Discharge Summary (Signed)
[facility] - [place] at [facility]   PATIENT NAME: Shaun Curtis    MR#:  [id number]  DATE OF BIRTH:  1929-02-18  DATE OF ADMISSION:  12/18/2015 ADMITTING PHYSICIAN: Auburn Bilberry, MD  DATE OF DISCHARGE: 12/19/2015  PRIMARY CARE PHYSICIAN: Harrington Challenger, DAVID, MD    ADMISSION DIAGNOSIS:  Junctional bradycardia [R00.1]  DISCHARGE DIAGNOSIS:  Active Problems:   Sick sinus syndrome (HCC)  bradycardia Chronic atrial fibrillation  SECONDARY DIAGNOSIS:   Past Medical History  Diagnosis Date  . Cardiomyopathy (HCC)   . Insomnia   . Obstructive sleep apnea   . Scrotal cyst   . Atrial fibrillation (HCC)   . BPH (benign prostatic hyperplasia)   . Gross hematuria   . Hypertension   . Hyperlipidemia   . Glaucoma   . COPD (chronic obstructive pulmonary disease) (HCC)   . Heart disease   . Allergic rhinitis     HOSPITAL COURSE:     Echocardiogram is done, follow-up with cardiology regarding final report. Preliminary report with ejection fraction 63%  DISCHARGE CONDITIONS:   Patient is a 81 year old white male presents with weakness shortness of breath nausea  1. Symptomatic bradycardia: Could be related to sick sinus syndrome  Bradycardia significantly improved after discontinuing propranolol  Cardiology Dr. Beola Cord shows has recommended to discontinue propranolol and outpatient follow-up as patient clinically improved  Telemetry is revealing heart rated around 50s to 60s at the time of discharge  2. Hypertension continue low-dose amlodipine  3. BPH continue Cardura and Proscar  4. Anxiety depression continue Paxil  5. History of COPD use when necessary nebulizers as needed  6. Hyperlipidemia continue Zocor  7. Miscellaneous DVT prophylaxis with heparin during hospital course  8. Chronic atrial fibrillation-Chad score 3, continue aspirin  PT did not recommend any physical therapy needs after discharge  CONSULTS OBTAINED:  Treatment Team:   Marcina Millard, MD   PROCEDURES none  DRUG ALLERGIES:  No Known Allergies  DISCHARGE MEDICATIONS:   Current Discharge Medication List    CONTINUE these medications which have NOT CHANGED   Details  amLODipine (NORVASC) 2.5 MG tablet TAKE 1 TABLET (2.5 MG TOTAL) BY MOUTH ONCE DAILY. FOR BLOOD PRESSURE Refills: 99   Associated Diagnoses: Blood in the urine    aspirin EC 81 MG tablet Take 81 mg by mouth daily.    Associated Diagnoses: Blood in the urine    Brinzolamide-Brimonidine (SIMBRINZA) 1-0.2 % SUSP Place 1 drop into both eyes 2 (two) times daily.    Associated Diagnoses: Blood in the urine    doxazosin (CARDURA) 4 MG tablet TAKE 1 TABLET (4 MG TOTAL) BY MOUTH NIGHTLY. FOR BLOOD PRESSURE AND PROSTATE Refills: 99   Associated Diagnoses: Blood in the urine; BPH with obstruction/lower urinary tract symptoms    finasteride (PROSCAR) 5 MG tablet Take 5 mg by mouth daily.    Associated Diagnoses: Blood in the urine; BPH with obstruction/lower urinary tract symptoms    Multiple Vitamins-Minerals (ICAPS) CAPS Take 1 capsule by mouth daily.    Associated Diagnoses: Blood in the urine    naproxen sodium (RA NAPROXEN SODIUM) 220 MG tablet Take 220 mg by mouth daily as needed.    Associated Diagnoses: Blood in the urine    PARoxetine (PAXIL) 20 MG tablet Take 20 mg by mouth every evening.    Associated Diagnoses: Blood in the urine    simvastatin (ZOCOR) 20 MG tablet Take 20 mg by mouth daily at 6 PM.    Associated Diagnoses: Blood  in the urine      STOP taking these medications     propranolol (INDERAL) 20 MG tablet          DISCHARGE INSTRUCTIONS:   Activity as tolerated Diet healthy heart Follow-up with primary care physician in a week Follow-up with Dr. Darrold JunkerParaschos was in a week Follow-up with urology Dr. Apolinar JunesBrandon as recommended or in a month  DIET:  Cardiac diet  DISCHARGE CONDITION:  Fair  ACTIVITY:  Activity as tolerated  OXYGEN:  Home Oxygen:  No.   Oxygen Delivery: room air  DISCHARGE LOCATION:  home   If you experience worsening of your admission symptoms, develop shortness of breath, life threatening emergency, suicidal or homicidal thoughts you must seek medical attention immediately by calling 911 or calling your MD immediately  if symptoms less severe.  You Must read complete instructions/literature along with all the possible adverse reactions/side effects for all the Medicines you take and that have been prescribed to you. Take any new Medicines after you have completely understood and accpet all the possible adverse reactions/side effects.   Please note  You were cared for by a hospitalist during your hospital stay. If you have any questions about your discharge medications or the care you received while you were in the hospital after you are discharged, you can call the unit and asked to speak with the hospitalist on call if the hospitalist that took care of you is not available. Once you are discharged, your primary care physician will handle any further medical issues. Please note that NO REFILLS for any discharge medications will be authorized once you are discharged, as it is imperative that you return to your primary care physician (or establish a relationship with a primary care physician if you do not have one) for your aftercare needs so that they can reassess your need for medications and monitor your lab values.     Today  Chief Complaint  Patient presents with  . Nausea  . Fatigue   Patient with no complaints. Denies any dizziness or palpitations. Wants to be discharged  ROS:  CONSTITUTIONAL: Denies fevers, chills. Denies any fatigue, weakness.  EYES: Denies blurry vision, double vision, eye pain. EARS, NOSE, THROAT: Denies tinnitus, ear pain, hearing loss. RESPIRATORY: Denies cough, wheeze, shortness of breath.  CARDIOVASCULAR: Denies chest pain, palpitations, edema.  GASTROINTESTINAL: Denies nausea,  vomiting, diarrhea, abdominal pain. Denies bright red blood per rectum. GENITOURINARY: Denies dysuria, hematuria. ENDOCRINE: Denies nocturia or thyroid problems. HEMATOLOGIC AND LYMPHATIC: Denies easy bruising or bleeding. SKIN: Denies rash or lesion. MUSCULOSKELETAL: Denies pain in neck, back, shoulder, knees, hips or arthritic symptoms.  NEUROLOGIC: Denies paralysis, paresthesias.  PSYCHIATRIC: Denies anxiety or depressive symptoms.   VITAL SIGNS:  Blood pressure 145/48, pulse 42, temperature 97.8 F (36.6 C), temperature source Oral, resp. rate 24, height 6\' 2"  (1.88 m), weight 115.758 kg (255 lb 3.2 oz), SpO2 96 %.  I/O:   Intake/Output Summary (Last 24 hours) at 12/19/15 1357 Last data filed at 12/19/15 1200  Gross per 24 hour  Intake    240 ml  Output   1575 ml  Net  -1335 ml    PHYSICAL EXAMINATION:  GENERAL:  81 y.o.-year-old patient lying in the bed with no acute distress.  EYES: Pupils equal, round, reactive to light and accommodation. No scleral icterus. Extraocular muscles intact.  HEENT: Head atraumatic, normocephalic. Oropharynx and nasopharynx clear.  NECK:  Supple, no jugular venous distention. No thyroid enlargement, no tenderness.  LUNGS: Normal  breath sounds bilaterally, no wheezing, rales,rhonchi or crepitation. No use of accessory muscles of respiration.  CARDIOVASCULAR: Irregularly irregular No murmurs, rubs, or gallops.  ABDOMEN: Soft, non-tender, non-distended. Bowel sounds present. No organomegaly or mass.  EXTREMITIES: No pedal edema, cyanosis, or clubbing.  NEUROLOGIC: Cranial nerves II through XII are intact. Muscle strength 5/5 in all extremities. Sensation intact. Gait not checked.  PSYCHIATRIC: The patient is alert and oriented x 3.  SKIN: No obvious rash, lesion, or ulcer.   DATA REVIEW:   CBC  Recent Labs Lab 12/19/15 0422  WBC 6.4  HGB 13.8  HCT 42.0  PLT 88*    Chemistries   Recent Labs Lab 12/18/15 1155 12/19/15 0422  NA 142  144  K 4.2 4.4  CL 105 106  CO2 26 30  GLUCOSE 109* 104*  BUN 17 15  CREATININE 0.62 0.78  CALCIUM 9.5 9.5  AST 27  --   ALT 24  --   ALKPHOS 66  --   BILITOT 1.7*  --     Cardiac Enzymes  Recent Labs Lab 12/19/15 0422  TROPONINI 0.05*    Microbiology Results  Results for orders placed or performed in visit on 08/24/15  Microscopic Examination     Status: Abnormal   Collection Time: 08/24/15 11:51 AM  Result Value Ref Range Status   WBC, UA 0-5 0 -  5 /hpf Final   RBC, UA 0-2 0 -  2 /hpf Final   Epithelial Cells (non renal) None seen 0 - 10 /hpf Final   Mucus, UA Present (A) Not Estab. Final   Bacteria, UA None seen None seen/Few Final    RADIOLOGY:  No results found.  EKG:   Orders placed or performed during the hospital encounter of 12/18/15  . ED EKG  . ED EKG  . EKG 12-Lead  . EKG 12-Lead      Management plans discussed with the patient, family and they are in agreement.  CODE STATUS:     Code Status Orders        Start     Ordered   12/18/15 1617  Full code   Continuous     12/18/15 1616      TOTAL TIME TAKING CARE OF THIS PATIENT: 45  minutes.    @  on 12/19/2015 at 1:57 PM  Between 7am to 6pm - Pager - [phone]  After 6pm go to [email or web address] - password [email or web address] [facility]  [place] [facility]  Office  [phone]  CC: Primary care physician; Mickey Farber, MD

## 2015-12-19 NOTE — Progress Notes (Signed)
*  PRELIMINARY RESULTS* Echocardiogram 2D Echocardiogram has been performed.  Shaun HousekeeperJerry R Curtis 12/19/2015, 1:42 PM

## 2015-12-19 NOTE — Progress Notes (Signed)
[facility]  SUBJECTIVE: I feel much better   Filed Vitals:   12/18/15 1510 12/18/15 1551 12/18/15 2003 12/19/15 0604  BP: 146/59 149/64 163/59 177/59  Pulse: 79 47 32 40  Temp:  97.8 F (36.6 C) 98.2 F (36.8 C) 98.2 F (36.8 C)  TempSrc:  Oral Oral Oral  Resp: 19 24    Height:  6\' 2"  (1.88 m)    Weight:  115.758 kg (255 lb 3.2 oz)    SpO2: 98% 99% 96% 96%     Intake/Output Summary (Last 24 hours) at 12/19/15 [id number] Last data filed at 12/19/15 [id number]  Gross per 24 hour  Intake      0 ml  Output   1450 ml  Net  -1450 ml      PHYSICAL EXAM  General: Well developed, well nourished, in no acute distress HEENT:  Normocephalic and atramatic Neck:  No JVD.  Lungs: Clear bilaterally to auscultation and percussion. Heart: HRRR . Normal S1 and S2 without gallops or murmurs.  Abdomen: Bowel sounds are positive, abdomen soft and non-tender  Msk:  Back normal, normal gait. Normal strength and tone for age. Extremities: No clubbing, cyanosis or edema.   Neuro: Alert and oriented X 3. Psych:  Good affect, responds appropriately   LABS: Basic Metabolic Panel:  Recent Labs  78/29/5612/19/16 1155 12/19/15 0422  NA 142 144  K 4.2 4.4  CL 105 106  CO2 26 30  GLUCOSE 109* 104*  BUN 17 15  CREATININE 0.62 0.78  CALCIUM 9.5 9.5   Liver Function Tests:  Recent Labs  12/18/15 1155  AST 27  ALT 24  ALKPHOS 66  BILITOT 1.7*  PROT 6.8  ALBUMIN 4.3    Recent Labs  12/18/15 1155  LIPASE 19   CBC:  Recent Labs  12/18/15 1155 12/19/15 0422  WBC 6.1 6.4  HGB 13.8 13.8  HCT 42.8 42.0  MCV 98.4 98.2  PLT 83* 88*   Cardiac Enzymes:  Recent Labs  12/18/15 1629 12/18/15 2309 12/19/15 0422  TROPONINI 0.04* 0.05* 0.05*   BNP: Invalid input(s): POCBNP D-Dimer: No results for input(s): DDIMER in the last 72 hours. Hemoglobin A1C: No results for input(s): HGBA1C in the last 72 hours. Fasting Lipid Panel: No results for input(s): CHOL, HDL, LDLCALC, TRIG,  CHOLHDL, LDLDIRECT in the last 72 hours. Thyroid Function Tests:  Recent Labs  12/18/15 1629  TSH 1.478   Anemia Panel: No results for input(s): VITAMINB12, FOLATE, FERRITIN, TIBC, IRON, RETICCTPCT in the last 72 hours.  No results found.   Echo   TELEMETRY: Atrial fibrillation:  ASSESSMENT AND PLAN:  Active Problems:   Sick sinus syndrome (HCC)    1. Bradycardia, improved, heart rates 60-70 bpm, although propanolol, clinically much improved 2. Chronic atrial fibrillation, chads Vasc 3, currently on aspirin  Recommendations  1. Continue to hold propanolol 2. Continue aspirin for stroke prevention for now. Will address chronic anticoagulation as outpatient. 3. If patient does well today, consider discharge dc later this afternoon   Vernee Baines, MD, PhD, [facility] 12/19/2015 9:06 AM

## 2015-12-19 NOTE — Care Management (Signed)
No discharge needs for patient.  discussed whether patient would have a holter /external monitor and informed it was not needed.

## 2015-12-19 NOTE — Progress Notes (Signed)
PT Cancellation Note  Patient Details Name: [name] MRN: [id number] DOB: 05/01/1929   Cancelled Treatment:    Reason Eval/Treat Not Completed: PT screened, no needs identified, will sign off. PT reviewed chart and discussed case with RN. RN stated she had just ambulated with patient, she noted minor balance deficits. PT offered balance testing and gait evaluation, patient declined stating he felt comfortable returning home and would use a walker at home if needed. Patient up ad lib as therapist entered the room, noted to be furniture cruising. Patient appears comfortable to return home in his current state of health, PT will sign off.   Kerin RansomPatrick A McNamara, PT, DPT    12/19/2015, 12:56 PM

## 2015-12-19 NOTE — Discharge Instructions (Signed)
Activity as tolerated Diet healthy heart Follow-up with primary care physician in a week Follow-up with Dr. Darrold JunkerParaschos was in a week Follow-up with urology Dr. Apolinar JunesBrandon as recommended or in a month

## 2016-02-01 ENCOUNTER — Other Ambulatory Visit: Payer: Self-pay

## 2016-02-01 DIAGNOSIS — N401 Enlarged prostate with lower urinary tract symptoms: Secondary | ICD-10-CM

## 2016-02-01 DIAGNOSIS — R319 Hematuria, unspecified: Secondary | ICD-10-CM

## 2016-02-01 DIAGNOSIS — N138 Other obstructive and reflux uropathy: Secondary | ICD-10-CM

## 2016-02-01 MED ORDER — FINASTERIDE 5 MG PO TABS: 5.0000 mg | ORAL_TABLET | Freq: Every day | ORAL | Status: DC

## 2016-02-01 NOTE — Progress Notes (Signed)
Pt pharmacy sent a refill request of finasteride. Last office note said continue finasteride and f/u in 1 year. Refills were given.

## 2016-02-22 ENCOUNTER — Telehealth: Payer: Self-pay | Admitting: Urology

## 2016-02-22 DIAGNOSIS — N138 Other obstructive and reflux uropathy: Secondary | ICD-10-CM

## 2016-02-22 DIAGNOSIS — R319 Hematuria, unspecified: Secondary | ICD-10-CM

## 2016-02-22 DIAGNOSIS — N401 Enlarged prostate with lower urinary tract symptoms: Secondary | ICD-10-CM

## 2016-02-22 NOTE — Telephone Encounter (Signed)
°  Patient's family member called today.  They are trying to get the patient's medication set up with his mail order pharmacy.  Please send his prescription for finasteride to [facility] for a 90-day supply.

## 2016-02-23 MED ORDER — FINASTERIDE 5 MG PO TABS: 5.0000 mg | ORAL_TABLET | Freq: Every day | ORAL | Status: DC

## 2016-02-23 NOTE — Telephone Encounter (Signed)
Medication sent to [facility]

## 2016-08-23 ENCOUNTER — Ambulatory Visit: Payer: Medicare Other | Admitting: Urology

## 2016-08-30 ENCOUNTER — Ambulatory Visit (INDEPENDENT_AMBULATORY_CARE_PROVIDER_SITE_OTHER): Payer: Medicare Other | Admitting: Urology

## 2016-08-30 ENCOUNTER — Encounter: Payer: Self-pay | Admitting: Urology

## 2016-08-30 VITALS — BP 161/73 | HR 42 | Ht 74.0 in | Wt 254.5 lb

## 2016-08-30 DIAGNOSIS — N401 Enlarged prostate with lower urinary tract symptoms: Secondary | ICD-10-CM | POA: Diagnosis not present

## 2016-08-30 DIAGNOSIS — N138 Other obstructive and reflux uropathy: Secondary | ICD-10-CM

## 2016-08-30 DIAGNOSIS — N200 Calculus of kidney: Secondary | ICD-10-CM | POA: Diagnosis not present

## 2016-08-30 DIAGNOSIS — R3129 Other microscopic hematuria: Secondary | ICD-10-CM

## 2016-08-30 LAB — URINALYSIS, COMPLETE
Bilirubin, UA: NEGATIVE
GLUCOSE, UA: NEGATIVE
KETONES UA: NEGATIVE
Leukocytes, UA: NEGATIVE
NITRITE UA: NEGATIVE
Protein, UA: NEGATIVE
SPEC GRAV UA: 1.02 (ref 1.005–1.030)
UUROB: 1 mg/dL (ref 0.2–1.0)
pH, UA: 7 (ref 5.0–7.5)

## 2016-08-30 LAB — MICROSCOPIC EXAMINATION: Bacteria, UA: NONE SEEN

## 2016-08-30 MED ORDER — FINASTERIDE 5 MG PO TABS: 5.0000 mg | ORAL_TABLET | Freq: Every day | ORAL | 3 refills | Status: AC

## 2016-08-30 MED ORDER — DOXAZOSIN MESYLATE 4 MG PO TABS: 4.0000 mg | ORAL_TABLET | Freq: Every day | ORAL | 3 refills | Status: AC

## 2016-08-30 NOTE — Progress Notes (Signed)
08/30/2016 3:06 PM   Shaun Curtis 10/07/1929 [id number]  Referring provider: Mickey Farberavid Thies, MD [address] [facility] [place], [place] [id number]  Chief Complaint  Patient presents with  . Follow-up    hematuria, BPH    HPI: 82 yo M who returns for routine follow up, last seen 07/2015 by Dr. Lenn Sinkahlsteadt  Kidney stones Incidental asymptomatic large bilateral kidney stones No issues with UTIs or flank pain Never previously passed stones No recent imaging  History of gross hematuria  No further epidoses of gross hematuria.   S/p work up including CT Urogram/ cystoscopy 07/2015  BPH Chronically on finasteride and doxin.  He voids well, no complaints.  He does have nocturia x2-3x which is stable.  He does avoid fluids before bedtime.    PMH: Past Medical History:  Diagnosis Date  . Allergic rhinitis   . Atrial fibrillation (HCC)   . BPH (benign prostatic hyperplasia)   . Cardiomyopathy (HCC)   . COPD (chronic obstructive pulmonary disease) (HCC)   . Glaucoma   . Gross hematuria   . Heart disease   . Hyperlipidemia   . Hypertension   . Insomnia   . Obstructive sleep apnea   . Scrotal cyst     Surgical History: Past Surgical History:  Procedure Laterality Date  . ANKLE SURGERY    . detached retina repair    . testicular cyst      Home Medications:    Medication List       Accurate as of 08/30/16  3:06 PM. Always use your most recent med list.          amLODipine 2.5 MG tablet Commonly known as:  NORVASC TAKE 1 TABLET (2.5 MG TOTAL) BY MOUTH ONCE DAILY. FOR BLOOD PRESSURE   aspirin EC 81 MG tablet Take 81 mg by mouth daily.   doxazosin 4 MG tablet Commonly known as:  CARDURA TAKE 1 TABLET (4 MG TOTAL) BY MOUTH NIGHTLY. FOR BLOOD PRESSURE AND PROSTATE   finasteride 5 MG tablet Commonly known as:  PROSCAR Take 1 tablet (5 mg total) by mouth daily.   ICAPS Caps Take 1 capsule by mouth daily.   PARoxetine 20 MG  tablet Commonly known as:  PAXIL Take 20 mg by mouth every evening.   RA NAPROXEN SODIUM 220 MG tablet Generic drug:  naproxen sodium Take 220 mg by mouth daily as needed.   SIMBRINZA 1-0.2 % Susp Generic drug:  Brinzolamide-Brimonidine Place 1 drop into both eyes 2 (two) times daily.   simvastatin 20 MG tablet Commonly known as:  ZOCOR Take 20 mg by mouth daily at 6 PM.       Allergies: No Known Allergies  Family History: Family History  Problem Relation Age of Onset  . Hypertension      Social History:  reports that he has never smoked. He does not have any smokeless tobacco history on file. He reports that he does not drink alcohol or use drugs.  ROS: UROLOGY Frequent Urination?: Yes Hard to postpone urination?: No Burning/pain with urination?: No Get up at night to urinate?: Yes Leakage of urine?: No Urine stream starts and stops?: No Trouble starting stream?: No Do you have to strain to urinate?: No Blood in urine?: No Urinary tract infection?: No Sexually transmitted disease?: No Injury to kidneys or bladder?: No Painful intercourse?: No Weak stream?: No Erection problems?: No Penile pain?: No  Gastrointestinal Nausea?: No Vomiting?: No Indigestion/heartburn?: No Diarrhea?: No Constipation?: Yes  Constitutional Fever: No Night sweats?: No Weight loss?: No Fatigue?: No  Skin Skin rash/lesions?: No Itching?: No  Eyes Blurred vision?: Yes Double vision?: Yes  Ears/Nose/Throat Sore throat?: No Sinus problems?: No  Hematologic/Lymphatic Swollen glands?: No Easy bruising?: No  Cardiovascular Leg swelling?: Yes Chest pain?: No  Respiratory Cough?: No Shortness of breath?: Yes  Endocrine Excessive thirst?: No  Musculoskeletal Back pain?: No Joint pain?: Yes  Neurological Headaches?: No Dizziness?: No  Psychologic Depression?: No Anxiety?: Yes  Physical Exam: BP (!) 161/73   Pulse (!) 42   Ht 6\' 2"  (1.88 m)   Wt 254  lb 8 oz (115.4 kg)   BMI 32.68 kg/m   Constitutional:  Alert and oriented, No acute distress.  Elderly.   HEENT: [place] AT, moist mucus membranes.  Trachea midline, no masses. Cardiovascular: No clubbing, cyanosis, or edema. Respiratory: Normal respiratory effort, no increased work of breathing. GI: Abdomen is soft, nontender, nondistended, no abdominal masses GU: No CVA tenderness.  Skin: No rashes, bruises or suspicious lesions. Neurologic: Grossly intact, no focal deficits, moving all 4 extremities. Psychiatric: Normal mood and affect.  Laboratory Data: Lab Results  Component Value Date   WBC 6.4 12/19/2015   HGB 13.8 12/19/2015   HCT 42.0 12/19/2015   MCV 98.2 12/19/2015   PLT 88 (L) 12/19/2015    Lab Results  Component Value Date   CREATININE 0.78 12/19/2015   Urinalysis Results for orders placed or performed in visit on 08/30/16  Microscopic Examination  Result Value Ref Range   WBC, UA 0-5 0 - 5 /hpf   RBC, UA >30 (A) 0 - 2 /hpf   Epithelial Cells (non renal) 0-10 0 - 10 /hpf   Bacteria, UA None seen None seen/Few  Urinalysis, Complete  Result Value Ref Range   Specific Gravity, UA 1.020 1.005 - 1.030   pH, UA 7.0 5.0 - 7.5   Color, UA Yellow Yellow   Appearance Ur Clear Clear   Leukocytes, UA Negative Negative   Protein, UA Negative Negative/Trace   Glucose, UA Negative Negative   Ketones, UA Negative Negative   RBC, UA 1+ (A) Negative   Bilirubin, UA Negative Negative   Urobilinogen, Ur 1.0 0.2 - 1.0 mg/dL   Nitrite, UA Negative Negative   Microscopic Examination See below:     Pertinent Imaging: CT Urogram on 8/16  Assessment & Plan:    1. BPH with obstruction/lower urinary tract symptoms Stable, well controlled symptoms continue finasteride/ doxazosin indefinitely, refilled x 1 year Ad - finasteride (PROSCAR) 5 MG tablet; Take 1 tablet (5 mg total) by mouth daily.  Dispense: 90 tablet; Refill: 3 - doxazosin (CARDURA) 4 MG tablet; Take 1 tablet (4  mg total) by mouth daily.  Dispense: 90 tablet; Refill: 3 - Urinalysis, Complete   2. Microscopic hematuria S/p work up in 2016 including CT urogram and cysto Likely secondary to large stone stone Persistent microscopic hematuria today  3. Kidney stones Large bilateral stones, larges 17 mm on left Remains asymptomatic other than microscopic hematuria Discussed consideration of KUB to assess stability, patient declines as he reports would not proceed with intervention unless has sequela from stone including pain, infection, or renal dysfunction- given his age it think this is very reasonable  Return if symptoms worsen or fail to improve.  Vanna Scotland, MD  [facility] [address], [address] [place], [place] [address] [phone]

## 2018-02-26 ENCOUNTER — Other Ambulatory Visit: Payer: Self-pay

## 2018-02-26 DIAGNOSIS — R310 Gross hematuria: Secondary | ICD-10-CM

## 2018-02-27 ENCOUNTER — Other Ambulatory Visit
Admission: RE | Admit: 2018-02-27 | Discharge: 2018-02-27 | Disposition: A | Discharge status: Home / Self Care | Payer: Medicare Other | Source: Ambulatory Visit | Attending: Urology | Admitting: Urology

## 2018-02-27 ENCOUNTER — Ambulatory Visit (INDEPENDENT_AMBULATORY_CARE_PROVIDER_SITE_OTHER): Payer: Medicare Other | Admitting: Urology

## 2018-02-27 VITALS — BP 164/57 | HR 47 | Ht 74.0 in | Wt 248.0 lb

## 2018-02-27 DIAGNOSIS — R310 Gross hematuria: Secondary | ICD-10-CM | POA: Diagnosis not present

## 2018-02-27 DIAGNOSIS — N200 Calculus of kidney: Secondary | ICD-10-CM

## 2018-02-27 DIAGNOSIS — N138 Other obstructive and reflux uropathy: Secondary | ICD-10-CM

## 2018-02-27 DIAGNOSIS — N401 Enlarged prostate with lower urinary tract symptoms: Secondary | ICD-10-CM | POA: Diagnosis not present

## 2018-02-27 LAB — URINALYSIS, COMPLETE (UACMP) WITH MICROSCOPIC
Bacteria, UA: NONE SEEN
Squamous Epithelial / LPF: NONE SEEN

## 2018-02-27 NOTE — Progress Notes (Signed)
02/27/2018 11:01 AM   Shaun Curtis 1929-04-17 [id number]  Referring provider: Mickey Farber, MD [address] [facility] [place], [place] [address]  Chief Complaint  Patient presents with  . Hematuria    HPI: 84 yo M  with multiple GU issues who returns today referred back for further episodes of gross hematuria.  Kidney stones Incidental asymptomatic large bilateral kidney stones-up to 7 mm in the left upper pole on CT scan 07/2015 No issues with UTIs or flank pain Never previously passed stones  History of gross hematuria  Recurrent intermittent episodes of gross hematuria, painless, has been occurring for days at a time, stops and starts. No clots  S/p work up including CT Urogram/ cystoscopy 07/2015  BPH Chronically on finasteride and doxin.  He voids well, no complaints.  He does have nocturia x2-3x which is stable.  He does avoid fluids before bedtime.     PMH: Past Medical History:  Diagnosis Date  . Allergic rhinitis   . Atrial fibrillation (HCC)   . BPH (benign prostatic hyperplasia)   . Cardiomyopathy (HCC)   . COPD (chronic obstructive pulmonary disease) (HCC)   . Glaucoma   . Gross hematuria   . Heart disease   . Hyperlipidemia   . Hypertension   . Insomnia   . Obstructive sleep apnea   . Scrotal cyst     Surgical History: Past Surgical History:  Procedure Laterality Date  . ANKLE SURGERY    . detached retina repair    . testicular cyst      Home Medications:  Allergies as of 02/27/2018   No Known Allergies     Medication List        Accurate as of 02/27/18 11:59 PM. Always use your most recent med list.          amLODipine 2.5 MG tablet Commonly known as:  NORVASC TAKE 1 TABLET (2.5 MG TOTAL) BY MOUTH ONCE DAILY. FOR BLOOD PRESSURE   aspirin EC 81 MG tablet Take 81 mg by mouth daily.   doxazosin 4 MG tablet Commonly known as:  CARDURA Take 1 tablet (4 mg total) by mouth daily.   finasteride 5 MG  tablet Commonly known as:  PROSCAR Take 1 tablet (5 mg total) by mouth daily.   ICAPS Caps Take 1 capsule by mouth daily.   PARoxetine 20 MG tablet Commonly known as:  PAXIL Take 20 mg by mouth every evening.   RA NAPROXEN SODIUM 220 MG tablet Generic drug:  naproxen sodium Take 220 mg by mouth daily as needed.   SIMBRINZA 1-0.2 % Susp Generic drug:  Brinzolamide-Brimonidine Place 1 drop into both eyes 2 (two) times daily.   simvastatin 20 MG tablet Commonly known as:  ZOCOR Take 20 mg by mouth daily at 6 PM.       Allergies: No Known Allergies  Family History: Family History  Problem Relation Age of Onset  . Hypertension Unknown     Social History:  reports that  has never smoked. He does not have any smokeless tobacco history on file. He reports that he does not drink alcohol or use drugs.  ROS: UROLOGY Frequent Urination?: No Hard to postpone urination?: No Burning/pain with urination?: No Get up at night to urinate?: No Leakage of urine?: No Urine stream starts and stops?: No Trouble starting stream?: No Do you have to strain to urinate?: No Blood in urine?: No Urinary tract infection?: No Sexually transmitted disease?: No Injury to kidneys  or bladder?: No Painful intercourse?: No Weak stream?: No Erection problems?: No Penile pain?: No  Gastrointestinal Nausea?: No Vomiting?: No Indigestion/heartburn?: No Diarrhea?: Yes Constipation?: No  Constitutional Fever: No Night sweats?: No Weight loss?: No Fatigue?: Yes  Skin Skin rash/lesions?: No Itching?: No  Eyes Blurred vision?: No Double vision?: No  Ears/Nose/Throat Sore throat?: No Sinus problems?: No  Hematologic/Lymphatic Swollen glands?: No Easy bruising?: No  Cardiovascular Leg swelling?: No Chest pain?: No  Respiratory Cough?: No Shortness of breath?: Yes  Endocrine Excessive thirst?: No  Musculoskeletal Back pain?: No Joint pain?:  Yes  Neurological Headaches?: No Dizziness?: No  Psychologic Depression?: Yes Anxiety?: Yes  Physical Exam: BP (!) 164/57   Pulse (!) 47   Ht 6\' 2"  (1.88 m)   Wt 248 lb (112.5 kg)   BMI 31.84 kg/m   Constitutional:  Alert and oriented, No acute distress. HEENT: [place] AT, moist mucus membranes.  Trachea midline, no masses. Cardiovascular: No clubbing, cyanosis, or edema. Respiratory: Normal respiratory effort, no increased work of breathing. GI: Abdomen is soft, nontender, nondistended, no abdominal masses GU: No CVA tenderness.  Skin: No rashes, bruises or suspicious lesions. Neurologic: Grossly intact, no focal deficits, moving all 4 extremities. Psychiatric: Normal mood and affect.  Laboratory Data: Lab Results  Component Value Date   WBC 6.4 12/19/2015   HGB 13.8 12/19/2015   HCT 42.0 12/19/2015   MCV 98.2 12/19/2015   PLT 88 (L) 12/19/2015    Lab Results  Component Value Date   CREATININE 0.78 12/19/2015    Urinalysis Results for orders placed or performed during the hospital encounter of 02/27/18  Urinalysis, Complete w Microscopic  Result Value Ref Range   Color, Urine BROWN (A) YELLOW   APPearance CLOUDY (A) CLEAR   Specific Gravity, Urine  1.005 - 1.030    TEST NOT REPORTED DUE TO COLOR INTERFERENCE OF URINE PIGMENT   pH  5.0 - 8.0    TEST NOT REPORTED DUE TO COLOR INTERFERENCE OF URINE PIGMENT   Glucose, UA (A) NEGATIVE mg/dL    TEST NOT REPORTED DUE TO COLOR INTERFERENCE OF URINE PIGMENT   Hgb urine dipstick (A) NEGATIVE    TEST NOT REPORTED DUE TO COLOR INTERFERENCE OF URINE PIGMENT   Bilirubin Urine (A) NEGATIVE    TEST NOT REPORTED DUE TO COLOR INTERFERENCE OF URINE PIGMENT   Ketones, ur (A) NEGATIVE mg/dL    TEST NOT REPORTED DUE TO COLOR INTERFERENCE OF URINE PIGMENT   Protein, ur (A) NEGATIVE mg/dL    TEST NOT REPORTED DUE TO COLOR INTERFERENCE OF URINE PIGMENT   Nitrite (A) NEGATIVE    TEST NOT REPORTED DUE TO COLOR INTERFERENCE OF URINE  PIGMENT   Leukocytes, UA (A) NEGATIVE    TEST NOT REPORTED DUE TO COLOR INTERFERENCE OF URINE PIGMENT   Squamous Epithelial / LPF NONE SEEN NONE SEEN   WBC, UA TOO NUMEROUS TO COUNT 0 - 5 WBC/hpf   RBC / HPF TOO NUMEROUS TO COUNT 0 - 5 RBC/hpf   Bacteria, UA NONE SEEN NONE SEEN   UCx pend  Pertinent Imaging: No new imaging  Assessment & Plan:    1. Gross hematuria Intermittent painless gross hematuria Previous negative workup in 2016 Will order renal ultrasound and plan for cystoscopy for further evaluation given that it is been several years Not currently on anticoagulation other than aspirin 81 mg Warning symptoms reviewed, voiding well without clots currently - US RENAL; Future - Urine Culture; Future  2. Kidney stones  Asymptomatic stones, will eval further with renal ultrasound as above  3. BPH with obstruction/lower urinary tract symptoms Continue finasteride and doxazosin Symptoms stable  Return for f/u RUS/ cystoscopy.  Vanna ScotlandAshley Dashia Caldeira, MD  [facility] [address], [address] [place], [place] [address] [phone]

## 2018-03-03 ENCOUNTER — Telehealth: Payer: Self-pay

## 2018-03-03 NOTE — Telephone Encounter (Signed)
Spoke with Crystal at [employer] and sensitivities were added.

## 2018-03-03 NOTE — Telephone Encounter (Signed)
-----   Message from Vanna ScotlandAshley Brandon, MD sent at 03/03/2018  8:42 AM EST ----- This is likely contaminant, but I would like to treat anyway.  Please call the lab and have them add on sensitivities.  Vanna ScotlandAshley Brandon, MD

## 2018-03-05 LAB — URINE CULTURE

## 2018-03-06 ENCOUNTER — Telehealth: Payer: Self-pay

## 2018-03-06 MED ORDER — CLINDAMYCIN HCL 300 MG PO CAPS: ORAL_CAPSULE | ORAL | 0 refills | Status: DC

## 2018-03-06 NOTE — Telephone Encounter (Signed)
-----   Message from Vanna ScotlandAshley Brandon, MD sent at 03/05/2018  3:49 PM EST ----- Lets treat this patient with clindamycin 600 mg p.o. twice daily for 7 days.  I believe his urine culture may be a contaminant but as a precaution in the setting of symptoms, let us go ahead and treat.  Vanna ScotlandAshley Brandon, MD

## 2018-03-06 NOTE — Telephone Encounter (Signed)
Spoke with pt caregiver in reference to abx. Shaun JonesCarolyn voiced understanding.

## 2018-03-24 ENCOUNTER — Ambulatory Visit
Admission: RE | Admit: 2018-03-24 | Discharge: 2018-03-24 | Disposition: A | Discharge status: Home / Self Care | Payer: Medicare Other | Source: Ambulatory Visit | Attending: Urology | Admitting: Urology

## 2018-03-24 DIAGNOSIS — R310 Gross hematuria: Secondary | ICD-10-CM | POA: Diagnosis present

## 2018-03-24 DIAGNOSIS — N281 Cyst of kidney, acquired: Secondary | ICD-10-CM | POA: Diagnosis not present

## 2018-03-24 DIAGNOSIS — N200 Calculus of kidney: Secondary | ICD-10-CM | POA: Diagnosis not present

## 2018-03-31 ENCOUNTER — Encounter: Payer: Self-pay | Admitting: Urology

## 2018-03-31 ENCOUNTER — Ambulatory Visit: Payer: Medicare Other | Admitting: Urology

## 2018-03-31 VITALS — BP 103/57 | HR 60 | Ht 74.0 in | Wt 250.0 lb

## 2018-03-31 DIAGNOSIS — N138 Other obstructive and reflux uropathy: Secondary | ICD-10-CM

## 2018-03-31 DIAGNOSIS — R310 Gross hematuria: Secondary | ICD-10-CM

## 2018-03-31 DIAGNOSIS — N200 Calculus of kidney: Secondary | ICD-10-CM

## 2018-03-31 DIAGNOSIS — N401 Enlarged prostate with lower urinary tract symptoms: Secondary | ICD-10-CM

## 2018-03-31 LAB — URINALYSIS, COMPLETE
Bilirubin, UA: NEGATIVE
NITRITE UA: NEGATIVE
Specific Gravity, UA: 1.02 (ref 1.005–1.030)
Urobilinogen, Ur: 1 mg/dL (ref 0.2–1.0)
pH, UA: 7 (ref 5.0–7.5)

## 2018-03-31 LAB — MICROSCOPIC EXAMINATION: EPITHELIAL CELLS (NON RENAL): NONE SEEN /HPF (ref 0–10)

## 2018-03-31 MED ORDER — LIDOCAINE HCL 2 % EX GEL
1.0000 "application " | Freq: Once | CUTANEOUS | Status: AC
  Administered 2018-03-31: 1 via URETHRAL

## 2018-03-31 MED ORDER — CIPROFLOXACIN HCL 500 MG PO TABS
500.0000 mg | ORAL_TABLET | Freq: Once | ORAL | Status: AC
  Administered 2018-03-31: 500 mg via ORAL

## 2018-03-31 NOTE — Progress Notes (Signed)
   03/31/18  CC:  Chief Complaint  Patient presents with  . Cysto    HPI: 84 year old with gross hematuria, bilateral nephrolithiasis and BPH who presents today for cystoscopy for further evaluation of intermittent episodes of painless gross hematuria.  Renal ultrasound from 03/24/2018 shows a large nonobstructing 2.3 cm left renal calculus, bilateral renal cysts without hydronephrosis.  He does have a large left perinephric fluid collection which appears simple in nature measuring 16.6 x 6 x 12.8 cm which is stable from previous scans and thought to be probably benign.  He denies any flank pain.  He does report improvement in his urinary symptoms after being treated for a urinary tract infection after last visit, 20,000 colonies of strep radians which was likely contaminant.  That being said, his urine was too numerous to count right blood cells and red blood cells at the time.    He now only gets up twice at night to void.  His uric daytime frequency and urgency is also decreased.    UA today shows greater than 30 red blood cells, otherwise no evidence of infection.    Blood pressure (!) 103/57, pulse 60, height 6\' 2"  (1.88 m), weight 250 lb (113.4 kg). NED. A&Ox3.   No respiratory distress   Abd soft, NT, ND Normal phallus with bilateral descended testicles Large left hydrocele  (asymptomatic)  Cystoscopy Procedure Note  Patient identification was confirmed, informed consent was obtained, and patient was prepped using Betadine solution.  Lidocaine jelly was administered per urethral meatus.    Preoperative abx where received prior to procedure.     Pre-Procedure: - Inspection reveals a normal caliber ureteral meatus.  Procedure: The flexible cystoscope was introduced without difficulty - No urethral strictures/lesions are present. - Enlarged prostate with trilobar coaptation, friable prostatic mucosa with active bleeding noted upon cystoscopic manipulation - Elevated  bladder neck - Bilateral ureteral orifices identified - Bladder mucosa  reveals no ulcers, tumors, or lesions -Small less than 5 mm stones layering at the bladder neck -Moderately to heavily trabeculated  Retroflexion shows intravesical protrusion with ectatic prostatic vessels   Post-Procedure: - Patient tolerated the procedure well  Assessment/ Plan:  1. Gross hematuria Cystoscopy today with active bleeding noted with cystoscopic manipulation Suspect bleeding related to friable prostatic mucosa as previously suspected Encouraged adequate hydration, continuation of 5 alpha reductase inhibitor - Urinalysis, Complete - ciprofloxacin (CIPRO) tablet 500 mg - lidocaine (XYLOCAINE) 2 % jelly 1 application - CULTURE, URINE COMPREHENSIVE  2. Kidney stones Large nonobstructing renal calculi, currently asymptomatic May be source of gross hematuria although less likely Given his age and comorbidities, will defer surgical intervention follow conservatively Patient is agreeable this plan Plan for KUB in 6 months for reassessment - DG Abd 1 View; Future - CULTURE, URINE COMPREHENSIVE  3. BPH with obstruction/lower urinary tract symptoms Chronic outlet obstruction with sequelae noted on cystoscopy today including heavy trabeculation Voiding with fairly reasonable urinary symptoms Continue finasteride and doxazosin Suboptimal surgical candidate   Return in about 6 months (around 09/30/2018) for KUB, IPSS/ PVR/ UA.  Vanna ScotlandAshley Quame Spratlin, MD

## 2018-04-02 LAB — CULTURE, URINE COMPREHENSIVE

## 2018-04-03 ENCOUNTER — Telehealth: Payer: Self-pay

## 2018-04-03 MED ORDER — SULFAMETHOXAZOLE-TRIMETHOPRIM 800-160 MG PO TABS: 1.0000 | ORAL_TABLET | Freq: Two times a day (BID) | ORAL | 0 refills | Status: AC

## 2018-04-03 NOTE — Telephone Encounter (Signed)
Spoke with pt daughter in law in reference to ucx results and abx. Made aware abx were sent to pharmacy. Daughter in law voiced understanding.

## 2018-04-03 NOTE — Telephone Encounter (Signed)
-----   Message from Vanna ScotlandAshley Brandon, MD sent at 04/03/2018 12:12 PM EDT ----- Please let this patient know that we checked his urine and send a culture when he came into the office earlier this week.  He did not growing bacteria that like to treat him with Bactrim DS twice daily for 7 days.  Vanna ScotlandAshley Brandon, MD

## 2018-07-27 ENCOUNTER — Emergency Department
Admission: EM | Admit: 2018-07-27 | Discharge: 2018-07-27 | Disposition: A | Discharge status: Home / Self Care | Payer: Medicare Other | Attending: Emergency Medicine | Admitting: Emergency Medicine

## 2018-07-27 ENCOUNTER — Emergency Department: Payer: Medicare Other

## 2018-07-27 ENCOUNTER — Other Ambulatory Visit: Payer: Self-pay

## 2018-07-27 ENCOUNTER — Encounter: Payer: Self-pay | Admitting: Emergency Medicine

## 2018-07-27 DIAGNOSIS — Y92129 Unspecified place in nursing home as the place of occurrence of the external cause: Secondary | ICD-10-CM | POA: Insufficient documentation

## 2018-07-27 DIAGNOSIS — S0990XA Unspecified injury of head, initial encounter: Secondary | ICD-10-CM

## 2018-07-27 DIAGNOSIS — S0003XA Contusion of scalp, initial encounter: Secondary | ICD-10-CM | POA: Insufficient documentation

## 2018-07-27 DIAGNOSIS — Z79899 Other long term (current) drug therapy: Secondary | ICD-10-CM | POA: Insufficient documentation

## 2018-07-27 DIAGNOSIS — J449 Chronic obstructive pulmonary disease, unspecified: Secondary | ICD-10-CM | POA: Insufficient documentation

## 2018-07-27 DIAGNOSIS — I10 Essential (primary) hypertension: Secondary | ICD-10-CM | POA: Diagnosis not present

## 2018-07-27 DIAGNOSIS — W19XXXA Unspecified fall, initial encounter: Secondary | ICD-10-CM

## 2018-07-27 DIAGNOSIS — W06XXXA Fall from bed, initial encounter: Secondary | ICD-10-CM | POA: Insufficient documentation

## 2018-07-27 DIAGNOSIS — Y999 Unspecified external cause status: Secondary | ICD-10-CM | POA: Insufficient documentation

## 2018-07-27 DIAGNOSIS — Y939 Activity, unspecified: Secondary | ICD-10-CM | POA: Insufficient documentation

## 2018-07-27 NOTE — ED Provider Notes (Signed)
[facility] Emergency Department Provider Note   ____________________________________________   First MD Initiated Contact with Patient 07/27/18 0206     (approximate)  I have reviewed the triage vital signs and the nursing notes.   HISTORY  Chief Complaint Fall, head injury   HPI Shaun Curtis is a 84 y.o. male brought to the ED from nursing facility with a chief complaint of mechanical fall.  Patient was trying to get out of bed but was too weak so he fell, striking his left head.  Not look to be on anticoagulants per his MAR.  Denies LOC.  Denies headache, vision changes, neck pain, chest pain, shortness of breath, abdominal pain, nausea or vomiting.   Past Medical History:  Diagnosis Date  . Allergic rhinitis   . Atrial fibrillation (HCC)   . BPH (benign prostatic hyperplasia)   . Cardiomyopathy (HCC)   . COPD (chronic obstructive pulmonary disease) (HCC)   . Glaucoma   . Gross hematuria   . Heart disease   . Hyperlipidemia   . Hypertension   . Insomnia   . Obstructive sleep apnea   . Scrotal cyst     Patient Active Problem List   Diagnosis Date Noted  . Sick sinus syndrome (HCC) 12/18/2015  . Allergic rhinitis 08/24/2015  . A-fib (HCC) 08/24/2015  . Benign fibroma of prostate 08/24/2015  . Cardiomyopathy (HCC) 08/24/2015  . Insomnia, persistent 08/24/2015  . CAFL (chronic airflow limitation) (HCC) 08/24/2015  . Clinical depression 08/24/2015  . Hypercholesteremia 08/24/2015  . Obstructive apnea 08/24/2015  . Cyst of testis 08/24/2015  . Change in blood platelet count 08/24/2015  . Calculus of kidney 02/28/2015  . Blood in the urine 02/21/2015  . Age-related macular degeneration 08/19/2014  . Benign essential tremor 08/19/2014  . Glaucoma 08/19/2014  . Detached retina 08/18/2014    Past Surgical History:  Procedure Laterality Date  . ANKLE SURGERY    . detached retina repair    . testicular cyst      Prior to  Admission medications   Medication Sig Start Date End Date Taking? Authorizing Provider  amLODipine (NORVASC) 2.5 MG tablet TAKE 1 TABLET (2.5 MG TOTAL) BY MOUTH ONCE DAILY. FOR BLOOD PRESSURE 08/03/15   [provider]  aspirin EC 81 MG tablet Take 81 mg by mouth daily.     [provider]  Brinzolamide-Brimonidine (SIMBRINZA) 1-0.2 % SUSP Place 1 drop into both eyes 2 (two) times daily.  08/18/14   [provider]  doxazosin (CARDURA) 4 MG tablet Take 1 tablet (4 mg total) by mouth daily. 08/30/16   Vanna ScotlandBrandon, Ashley, MD  finasteride (PROSCAR) 5 MG tablet Take 1 tablet (5 mg total) by mouth daily. 08/30/16   Vanna ScotlandBrandon, Ashley, MD  Multiple Vitamins-Minerals (ICAPS) CAPS Take 1 capsule by mouth daily.     [provider]  naproxen sodium (RA NAPROXEN SODIUM) 220 MG tablet Take 220 mg by mouth daily as needed.     [provider]  PARoxetine (PAXIL) 20 MG tablet Take 20 mg by mouth every evening.  02/21/15   [provider]  simvastatin (ZOCOR) 20 MG tablet Take 20 mg by mouth daily at 6 PM.  08/23/15   [provider]    Allergies Patient has no known allergies.  Family History  Problem Relation Age of Onset  . Hypertension Unknown     Social History Social History   Tobacco Use  . Smoking status: Never Smoker  . Smokeless  tobacco: Never Used  Substance Use Topics  . Alcohol use: No    Alcohol/week: 0.0 oz  . Drug use: No    Review of Systems  Constitutional: No fever/chills Eyes: No visual changes. ENT: No sore throat. Cardiovascular: Denies chest pain. Respiratory: Denies shortness of breath. Gastrointestinal: No abdominal pain.  No nausea, no vomiting.  No diarrhea.  No constipation. Genitourinary: Negative for dysuria. Musculoskeletal: Negative for back pain. Skin: Positive for scalp hematoma.  Negative for rash. Neurological: negative for headaches, focal weakness or  numbness.   ____________________________________________   PHYSICAL EXAM:  VITAL SIGNS: ED Triage Vitals  Enc Vitals Group     BP      Pulse      Resp      Temp      Temp src      SpO2      Weight      Height      Head Circumference      Peak Flow      Pain Score      Pain Loc      Pain Edu?      Excl. in GC?     Constitutional: Alert and oriented. Well appearing and in no acute distress. Eyes: Conjunctivae are normal. PERRL. EOMI. Head: Left parietal scalp hematoma.  Old ecchymosis to right periorbital region from fall last week Nose: No external evidence of injury. Mouth/Throat: Mucous membranes are moist.  No dental malocclusion. Neck: No stridor.  No cervical spine tenderness to palpation. Cardiovascular: Normal rate, regular rhythm. Grossly normal heart sounds.  Good peripheral circulation. Respiratory: Normal respiratory effort.  No retractions. Lungs CTAB. Gastrointestinal: Soft and nontender. No distention. No abdominal bruits. No CVA tenderness. Genitourinary: Indwelling Foley in place Musculoskeletal: No lower extremity tenderness nor edema.  No joint effusions. Neurologic: Alert and oriented x2.  CN II-XII grossly intact.  Normal speech and language. No gross focal neurologic deficits are appreciated. MAEx4. Skin:  Skin is warm, dry and intact. No rash noted. Psychiatric: Mood and affect are normal. Speech and behavior are normal.  ____________________________________________   LABS (all labs ordered are listed, but only abnormal results are displayed)  Labs Reviewed - No data to display ____________________________________________  EKG  ED ECG REPORT I, SUNG,JADE J, the attending physician, personally viewed and interpreted this ECG.   Date: 07/27/2018  EKG Time: 0214  Rate: 47  Rhythm: Junctional bradycardia  Axis: LAD  Intervals:left anterior fascicular block  ST&T Change:  Nonspecific  ____________________________________________  RADIOLOGY  ED MD interpretation:  No ICH  Official radiology report(s): Ct Head Wo Contrast  Result Date: 07/27/2018 CLINICAL DATA:  Mechanical fall going from bed to chair. Left scalp hematoma. Technologist notes state forehead bruising is reportedly not acute. EXAM: CT HEAD WITHOUT CONTRAST TECHNIQUE: Contiguous axial images were obtained from the base of the skull through the vertex without intravenous contrast. COMPARISON:  None. FINDINGS: Brain: Generalized atrophy, normal for age. Mild chronic small vessel ischemia. No intracranial hemorrhage, mass effect, or midline shift. No hydrocephalus. The basilar cisterns are patent. No evidence of territorial infarct or acute ischemia. No extra-axial or intracranial fluid collection. Vascular: Atherosclerosis of skullbase vasculature without hyperdense vessel or abnormal calcification. Skull: No fracture or focal lesion. Sinuses/Orbits: Postsurgical change of the right globe. No acute findings. Other: Left parietal scalp hematoma.  Right frontal scalp hematoma. IMPRESSION: 1. Scalp hematomas without skull fracture or acute intracranial abnormality. 2. Age related atrophy and chronic small vessel ischemia. Electronically Signed  By: Rubye Oaks M.D.   On: 07/27/2018 02:41    ____________________________________________   PROCEDURES  Procedure(s) performed: None  Procedures  Critical Care performed: No  ____________________________________________   INITIAL IMPRESSION / ASSESSMENT AND PLAN / ED COURSE  As part of my medical decision making, I reviewed the following data within the electronic MEDICAL RECORD NUMBER Nursing notes reviewed and incorporated, EKG interpreted, Old chart reviewed, Radiograph reviewed and Notes from prior ED visits   84 year old male who presents status post mechanical fall with scalp hematoma.  Differential diagnosis includes but is not limited to ICH,  skull fracture, scalp hematoma, etc.  Will obtain CT head to evaluate for intracranial injury.  I personally reviewed patient's old records which indicate he was recently hospitalized at [facility] for recurrent falls and discharged to the nursing facility on 7/24.  He was discharged with oral vancomycin taper and Foley catheter.  He was found to be bradycardic and had a cardiac work-up.   Clinical Course as of Jul 27 326  Mon Jul 27, 2018  0327 Updated patient and family members of CT results.  Heart rate is currently 55-58.  Strict return precautions given.  All verbalize understanding and agree with plan of care.   [JS]    Clinical Course User Index [JS] Irean Hong, MD     ____________________________________________   FINAL CLINICAL IMPRESSION(S) / ED DIAGNOSES  Final diagnoses:  Fall, initial encounter  Injury of head, initial encounter  Contusion of scalp, initial encounter     ED Discharge Orders    None       Note:  This document was prepared using Dragon voice recognition software and may include unintentional dictation errors.    Irean Hong, MD 07/27/18 [phone]

## 2018-07-27 NOTE — Discharge Instructions (Addendum)
Continue all medicines as directed by your doctor.  Return to the ER for worsening symptoms, persistent vomiting, difficulty breathing, lethargy or other concerns.

## 2018-07-27 NOTE — ED Notes (Signed)
Pt attempted to leave bed, Dr Dolores FrameSung notified, sitter ordered, Sarah at bedside, pt reports I just want "to go pee"   Urine bag emptied, foley at penis appears intact without trauma, stat-lock device at to left thigh

## 2018-07-27 NOTE — ED Notes (Addendum)
Patient transported to CT. Family at bedside.  

## 2018-07-27 NOTE — ED Notes (Addendum)
No IV  Discharge instructions reviewed with patient. Questions fielded by this RN. Patient verbalizes understanding of instructions. Patient discharged home in stable condition per sung. No acute distress noted at time of discharge.

## 2018-07-27 NOTE — ED Triage Notes (Signed)
Patient presents to Emergency Department via EMS with complaints of fall and hematoma to back of left occiput. Pt from [facility].   Right side of face and forehead bruised from prior fall.  Per EMS pt's urine is orange

## 2018-07-27 NOTE — ED Notes (Signed)
EMS leaving to transport pt back to [facility], facility called for pt arriving, declined to give name

## 2018-08-24 ENCOUNTER — Ambulatory Visit: Payer: Medicare Other | Admitting: Urology

## 2018-08-30 DEATH — deceased

## 2018-09-21 ENCOUNTER — Ambulatory Visit: Payer: Medicare Other

## 2018-10-02 ENCOUNTER — Ambulatory Visit: Payer: Medicare Other | Admitting: Urology

## 2018-10-16 IMAGING — CT CT HEAD W/O CM
2 series · 15 of 37 positions shown, 18 images · non-contrast
Comparison: None.

CLINICAL DATA: Mechanical fall going from bed to chair. Left scalp
hematoma. Technologist notes state forehead bruising is reportedly
not acute.

EXAM:
CT HEAD WITHOUT CONTRAST
TECHNIQUE: Contiguous axial images were obtained from the base of the skull
through the vertex without intravenous contrast.

[Series 2: head wo · axial · 0.43mm/px · z∈[-135,+5]mm · 12 of 34 slices shown, 15 images]
[im 3/34  brain]
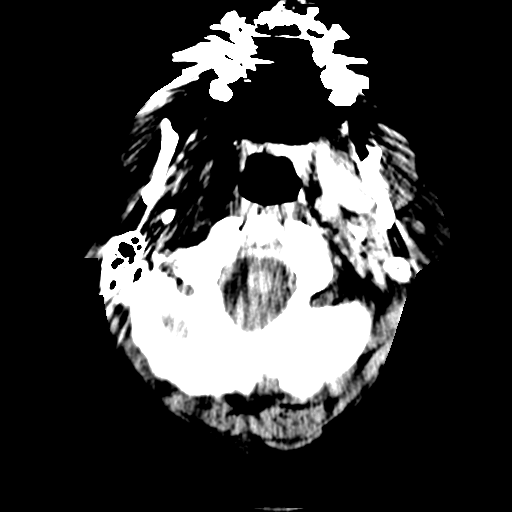
[im 3/34  bone]
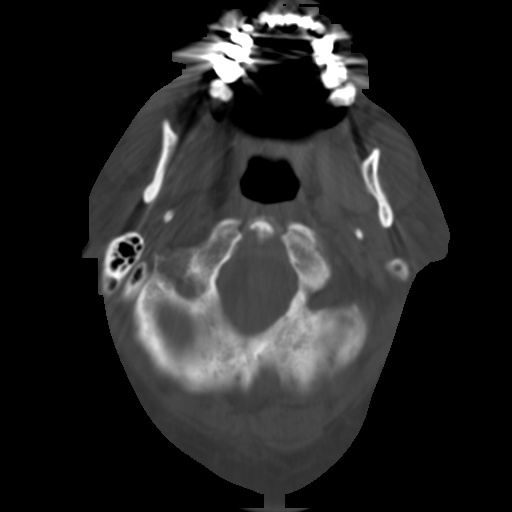
[im 5/34  brain]
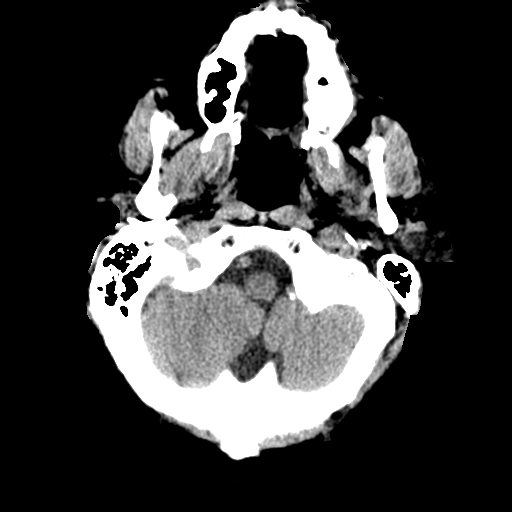
[im 7/34  brain]
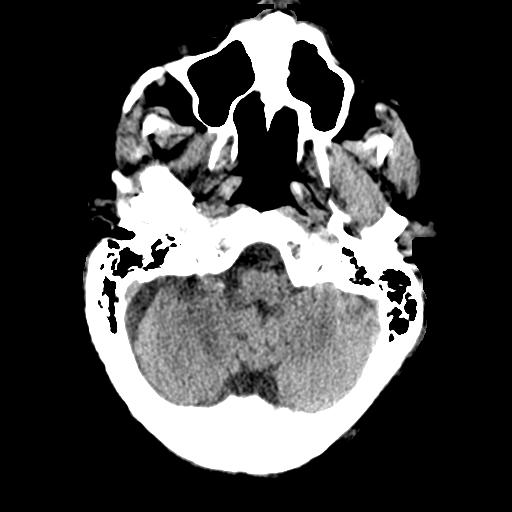
[im 11/34  brain]
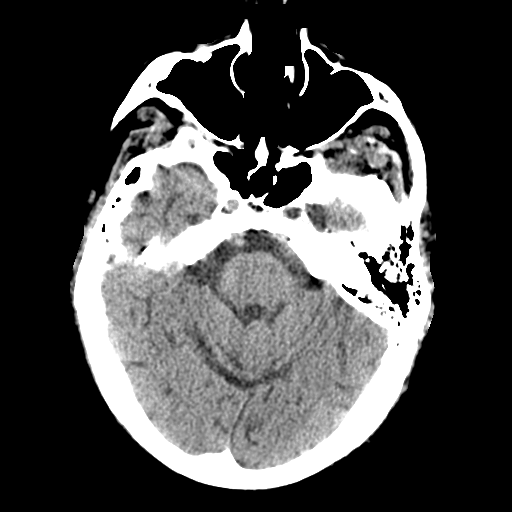
[im 13/34  brain]
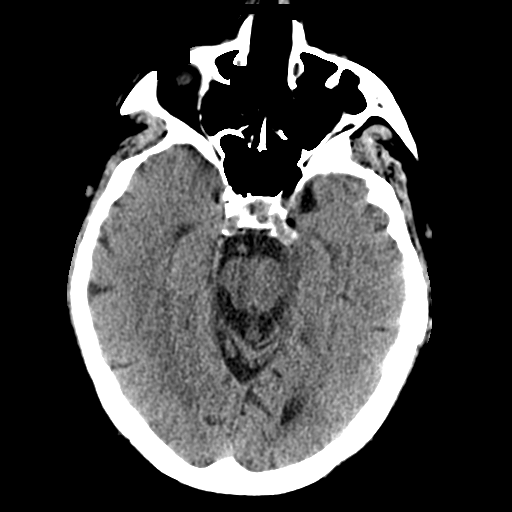
[im 13/34  bone]
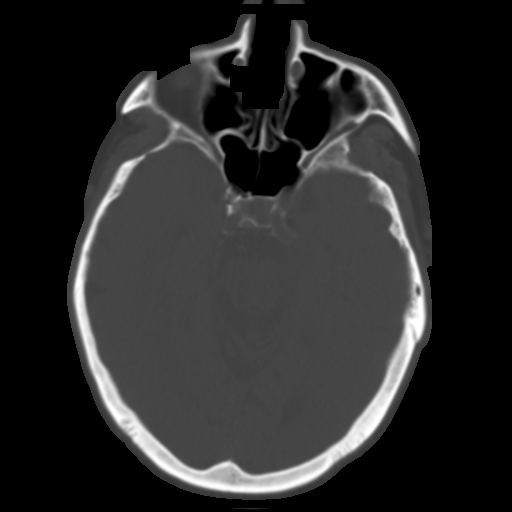
[im 15/34  brain]
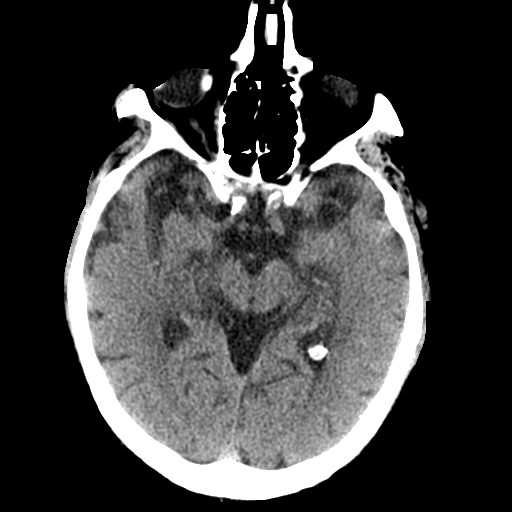
[im 19/34  brain]
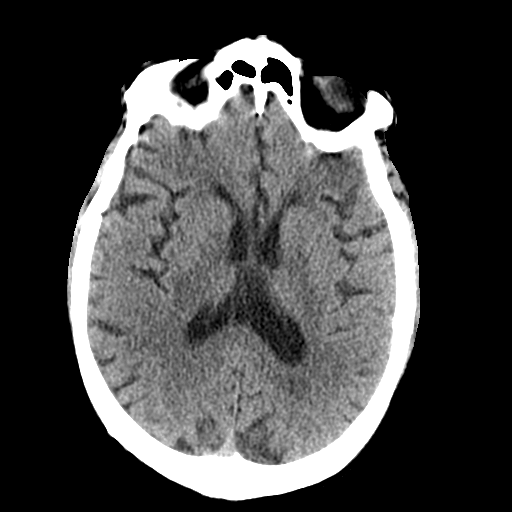
[im 21/34  brain]
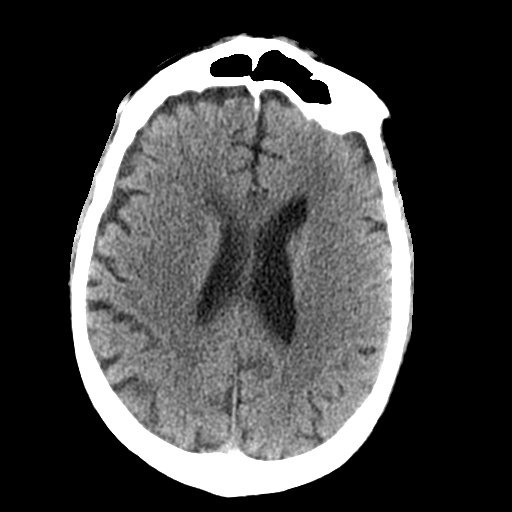
[im 23/34  brain]
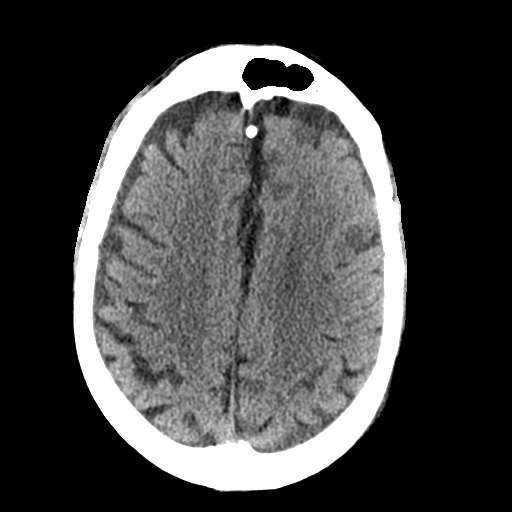
[im 23/34  bone]
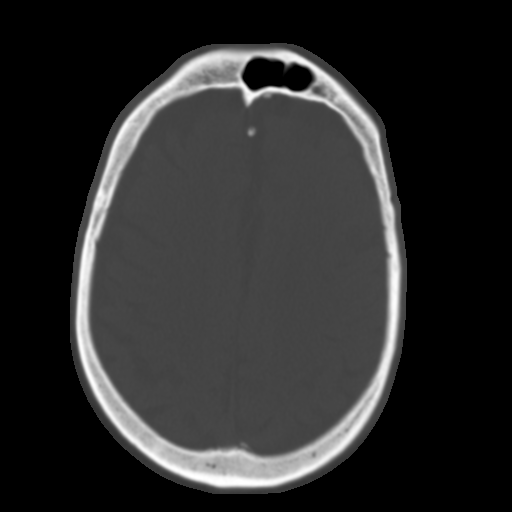
[im 27/34  brain]
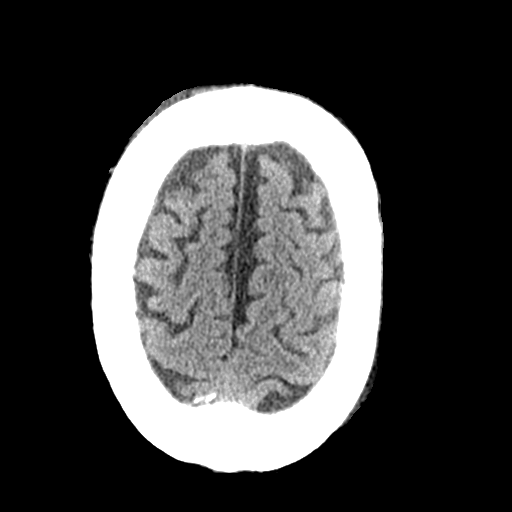
[im 29/34  brain]
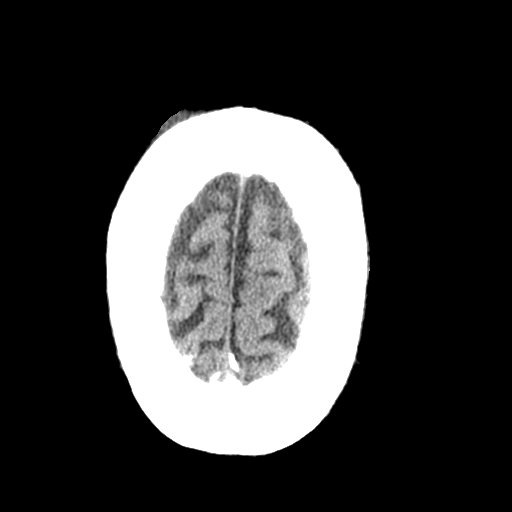
[im 31/34  brain]
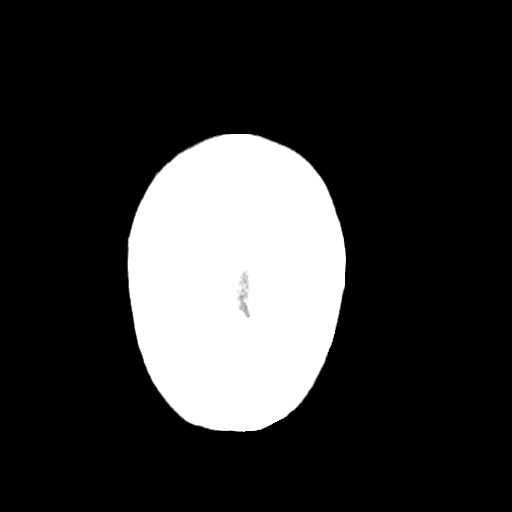

[Series 5: sagittal soft tissue · sagittal · 0.31mm/px · 3 of 54 slices shown]
[im 18/54  brain]
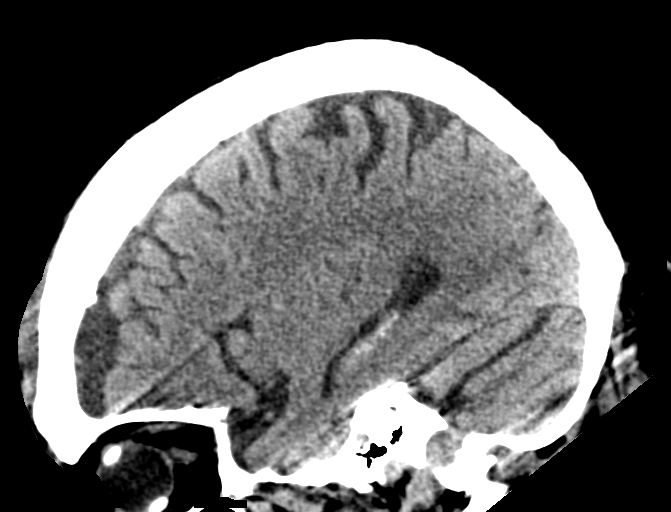
[im 27/54  brain]
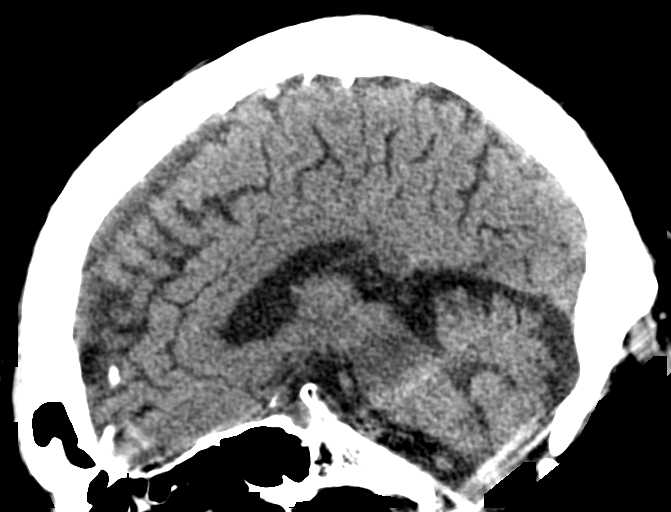
[im 36/54  brain]
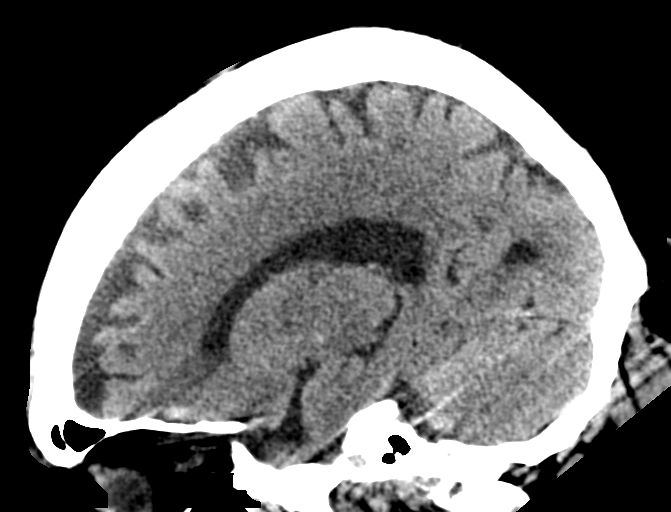

[15 of 37 positions shown; findings below may reference images not displayed]

FINDINGS: Brain: Generalized atrophy, normal for age. Mild chronic small
vessel ischemia. No intracranial hemorrhage, mass effect, or midline
shift. No hydrocephalus. The basilar cisterns are patent. No
evidence of territorial infarct or acute ischemia. No extra-axial or
intracranial fluid collection.

Vascular: Atherosclerosis of skullbase vasculature without
hyperdense vessel or abnormal calcification.

Skull: No fracture or focal lesion.

Sinuses/Orbits: Postsurgical change of the right globe. No acute
findings.

Other: Left parietal scalp hematoma.  Right frontal scalp hematoma.
IMPRESSION: 1. Scalp hematomas without skull fracture or acute intracranial
abnormality.
2. Age related atrophy and chronic small vessel ischemia.
